# Patient Record
Sex: Male | Born: 1940 | Race: White | Hispanic: No | Marital: Married | Smoking: Never smoker
Health system: Southern US, Community
[De-identification: ages and names within clinical notes are randomized; demographics above are authoritative.]

## PROBLEM LIST (undated history)

## (undated) DIAGNOSIS — H539 Unspecified visual disturbance: Secondary | ICD-10-CM

## (undated) DIAGNOSIS — C61 Malignant neoplasm of prostate: Secondary | ICD-10-CM

## (undated) DIAGNOSIS — I2510 Atherosclerotic heart disease of native coronary artery without angina pectoris: Secondary | ICD-10-CM

## (undated) DIAGNOSIS — C801 Malignant (primary) neoplasm, unspecified: Secondary | ICD-10-CM

## (undated) DIAGNOSIS — I10 Essential (primary) hypertension: Secondary | ICD-10-CM

## (undated) HISTORY — DX: Malignant neoplasm of prostate: C61

## (undated) HISTORY — DX: Unspecified visual disturbance: H53.9

---

## 2017-10-13 ENCOUNTER — Emergency Department (HOSPITAL_COMMUNITY): Payer: Medicare Other

## 2017-10-13 ENCOUNTER — Emergency Department (HOSPITAL_COMMUNITY)
Admission: EM | Admit: 2017-10-13 | Discharge: 2017-10-13 | Disposition: A | Discharge status: Home / Self Care | Payer: Medicare Other | Attending: Emergency Medicine | Admitting: Emergency Medicine

## 2017-10-13 ENCOUNTER — Encounter (HOSPITAL_COMMUNITY): Payer: Self-pay

## 2017-10-13 DIAGNOSIS — Z8546 Personal history of malignant neoplasm of prostate: Secondary | ICD-10-CM | POA: Insufficient documentation

## 2017-10-13 DIAGNOSIS — I10 Essential (primary) hypertension: Secondary | ICD-10-CM | POA: Diagnosis not present

## 2017-10-13 DIAGNOSIS — I2510 Atherosclerotic heart disease of native coronary artery without angina pectoris: Secondary | ICD-10-CM | POA: Diagnosis not present

## 2017-10-13 DIAGNOSIS — R29898 Other symptoms and signs involving the musculoskeletal system: Secondary | ICD-10-CM | POA: Diagnosis not present

## 2017-10-13 HISTORY — DX: Atherosclerotic heart disease of native coronary artery without angina pectoris: I25.10

## 2017-10-13 HISTORY — DX: Malignant (primary) neoplasm, unspecified: C80.1

## 2017-10-13 HISTORY — DX: Essential (primary) hypertension: I10

## 2017-10-13 LAB — URINALYSIS, ROUTINE W REFLEX MICROSCOPIC
BILIRUBIN URINE: NEGATIVE
Glucose, UA: NEGATIVE mg/dL
HGB URINE DIPSTICK: NEGATIVE
KETONES UR: NEGATIVE mg/dL
Leukocytes, UA: NEGATIVE
Nitrite: NEGATIVE
PROTEIN: NEGATIVE mg/dL
Specific Gravity, Urine: 1.02 (ref 1.005–1.030)
pH: 5 (ref 5.0–8.0)

## 2017-10-13 LAB — COMPREHENSIVE METABOLIC PANEL
ALBUMIN: 4 g/dL (ref 3.5–5.0)
ALK PHOS: 76 U/L (ref 38–126)
ALT: 20 U/L (ref 17–63)
ANION GAP: 11 (ref 5–15)
AST: 24 U/L (ref 15–41)
BILIRUBIN TOTAL: 1.4 mg/dL — AB (ref 0.3–1.2)
BUN: 17 mg/dL (ref 6–20)
CALCIUM: 8.9 mg/dL (ref 8.9–10.3)
CO2: 23 mmol/L (ref 22–32)
CREATININE: 0.83 mg/dL (ref 0.61–1.24)
Chloride: 105 mmol/L (ref 101–111)
GFR calc Af Amer: >60 mL/min (ref >60)
GFR calc non Af Amer: >60 mL/min (ref >60)
GLUCOSE: 111 mg/dL — AB (ref 65–99)
Potassium: 4.1 mmol/L (ref 3.5–5.1)
Sodium: 139 mmol/L (ref 135–145)
TOTAL PROTEIN: 6.5 g/dL (ref 6.5–8.1)

## 2017-10-13 LAB — CBC WITH DIFFERENTIAL/PLATELET
BASOS PCT: 0 %
Basophils Absolute: 0 10*3/uL (ref 0.0–0.1)
Eosinophils Absolute: 0.1 10*3/uL (ref 0.0–0.7)
Eosinophils Relative: 2 %
HEMATOCRIT: 40.3 % (ref 39.0–52.0)
HEMOGLOBIN: 13 g/dL (ref 13.0–17.0)
LYMPHS ABS: 1.3 10*3/uL (ref 0.7–4.0)
Lymphocytes Relative: 22 %
MCH: 30.1 pg (ref 26.0–34.0)
MCHC: 32.3 g/dL (ref 30.0–36.0)
MCV: 93.3 fL (ref 78.0–100.0)
Monocytes Absolute: 0.4 10*3/uL (ref 0.1–1.0)
Monocytes Relative: 6 %
NEUTROS ABS: 4.2 10*3/uL (ref 1.7–7.7)
NEUTROS PCT: 70 %
Platelets: 178 10*3/uL (ref 150–400)
RBC: 4.32 MIL/uL (ref 4.22–5.81)
RDW: 13 % (ref 11.5–15.5)
WBC: 5.9 10*3/uL (ref 4.0–10.5)

## 2017-10-13 MED ORDER — MORPHINE SULFATE (PF) 4 MG/ML IV SOLN: 2.0000 mg | INTRAVENOUS | Status: DC | PRN

## 2017-10-13 MED ORDER — ONDANSETRON HCL 4 MG/2ML IJ SOLN: 4.0000 mg | Freq: Once | INTRAMUSCULAR | Status: DC

## 2017-10-13 MED ORDER — SODIUM CHLORIDE 0.9 % IV BOLUS (SEPSIS): 500.0000 mL | Freq: Once | INTRAVENOUS | Status: DC

## 2017-10-13 NOTE — ED Notes (Signed)
ED Provider at bedside. 

## 2017-10-13 NOTE — ED Provider Notes (Signed)
[facility]PART[facility] Provider Note   CSN: [id number] Arrival date & time: 10/13/17  1761     History   Chief Complaint Chief Complaint  Patient presents with  . Fall    HPI Isaac Escobar is a 77 y.o. male.  Chief complaint is left leg weakness  HPI 77 year old male.  He describes occasional falling for the last year.  It has been happening more frequently perhaps over the last 2 months.  He notes that when he walks his left leg seems weak and seems to "drag behind me".  He does not have pain in the leg.  He does have occasional right lower back pain.  No history of strokes.  He states at one point he was told by a physician that he "might of had a mini stroke".  He has some right lower back pain.  A year ago he had an MRI that showed "something on the right side".  He sees a neurologist that occasionally gives him injections into his right back.  He feels frustrated that his physicians have not addressed his left leg weakness.  Prostate cancer thought to be in remission.  No known metastases.  Has had stable PSAs for over 3 years.  Past Medical History:  Diagnosis Date  . Cancer [facility])    prostate  . Coronary artery disease   . Hypertension     There are no active problems to display for this patient.   History reviewed. No pertinent surgical history.     Home Medications    Prior to Admission medications   Not on File    Family History No family history on file.  Social History Social History   Tobacco Use  . Smoking status: Not on file  Substance Use Topics  . Alcohol use: Not on file  . Drug use: Not on file     Allergies   Patient has no known allergies.   Review of Systems Review of Systems  Constitutional: Negative for appetite change, chills, diaphoresis, fatigue and fever.  HENT: Negative for mouth sores, sore throat and trouble swallowing.   Eyes: Negative for visual disturbance.  Respiratory: Negative for  cough, chest tightness, shortness of breath and wheezing.   Cardiovascular: Negative for chest pain.  Gastrointestinal: Negative for abdominal distention, abdominal pain, diarrhea, nausea and vomiting.  Endocrine: Negative for polydipsia, polyphagia and polyuria.  Genitourinary: Negative for dysuria, frequency and hematuria.  Musculoskeletal: Positive for back pain. Negative for gait problem.  Skin: Negative for color change, pallor and rash.  Neurological: Positive for weakness. Negative for dizziness, syncope, light-headedness and headaches.  Hematological: Does not bruise/bleed easily.  Psychiatric/Behavioral: Negative for behavioral problems and confusion.     Physical Exam Updated Vital Signs BP 132/88   Pulse (!) 56   Temp 98.3 F (36.8 C) (Oral)   Resp 15   SpO2 96%   Physical Exam  Constitutional: He is oriented to person, place, and time. He appears well-developed and well-nourished. No distress.  HENT:  Head: Normocephalic.  Eyes: Conjunctivae are normal. Pupils are equal, round, and reactive to light. No scleral icterus.  Neck: Normal range of motion. Neck supple. No thyromegaly present.  Cardiovascular: Normal rate and regular rhythm. Exam reveals no gallop and no friction rub.  No murmur heard. Pulmonary/Chest: Effort normal and breath sounds normal. No respiratory distress. He has no wheezes. He has no rales.  Abdominal: Soft. Bowel sounds are normal. He exhibits no distension. There is no  tenderness. There is no rebound.  Musculoskeletal: Normal range of motion.  When he ambulates he does lead with his right leg and his left leg has difficulty flexing forward.  He does not have frank foot drop.  He does have a bit of a Trendelenburg gait.  Neurological: He is alert and oriented to person, place, and time.  Normal sensation of the bilateral lower extremities.  Reflexes are symmetric.  No gross asymmetry observed supine.  See muscular skeletal regarding strength    Skin: Skin is warm and dry. No rash noted.  Psychiatric: He has a normal mood and affect. His behavior is normal.     ED Treatments / Results  Labs (all labs ordered are listed, but only abnormal results are displayed) Labs Reviewed  COMPREHENSIVE METABOLIC PANEL - Abnormal; Notable for the following components:      Result Value   Glucose, Bld 111 (*)    Total Bilirubin 1.4 (*)    All other components within normal limits  CBC WITH DIFFERENTIAL/PLATELET  URINALYSIS, ROUTINE W REFLEX MICROSCOPIC    EKG  EKG Interpretation None       Radiology Dg Lumbar Spine Complete  Result Date: 10/13/2017 CLINICAL DATA:  Frequent mechanical falls. EXAM: LUMBAR SPINE - COMPLETE 4+ VIEW COMPARISON:  None. FINDINGS: Five non-rib-bearing vertebral bodies. Anterior compression deformity of L1 vertebral body with approximately 30% height loss. Multilevel osteoarthritic changes with disc space narrowing, endplate sclerosis and remodeling of vertebral bodies. Reversal of lumbosacral lordosis. Heavy calcific atherosclerotic disease of the aorta. 1.6 cm calcific density overlies the right upper quadrant, possibly representing a gallbladder stone. IMPRESSION: L1 anterior compression deformity with approximately 30% height loss likely represents age-indeterminate fracture. 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 Calcific atherosclerotic disease of the aorta. Probable cholelithiasis. Electronically Signed   By: Fidela Salisbury M.D.   On: 10/13/2017 12:36   Ct Head Wo Contrast  Result Date: 10/13/2017 CLINICAL DATA:  77 year old male with history of multiple falls over the past several weeks. EXAM: CT HEAD WITHOUT CONTRAST TECHNIQUE: Contiguous axial images were obtained from the base of the skull through the vertex without intravenous contrast. COMPARISON:  No priors. FINDINGS: Brain: Patchy and confluent areas of mild decreased attenuation are noted throughout the deep and  periventricular white matter of the cerebral hemispheres bilaterally, compatible with mild chronic microvascular ischemic disease. No evidence of acute infarction, hemorrhage, hydrocephalus, extra-axial collection or mass lesion/mass effect. Vascular: No hyperdense vessel or unexpected calcification. Skull: Normal. Negative for fracture or focal lesion. Sinuses/Orbits: No acute finding. Other: None. IMPRESSION: 1. No acute intracranial abnormalities. 2. Mild chronic microvascular ischemic changes in the cerebral white matter. Electronically Signed   By: Vinnie Langton M.D.   On: 10/13/2017 11:57   Mr Lumbar Spine Wo Contrast  Result Date: 10/13/2017 CLINICAL DATA:  Recent falls.  Left foot dragging.  Low back pain EXAM: MRI LUMBAR SPINE WITHOUT CONTRAST TECHNIQUE: Multiplanar, multisequence MR imaging of the lumbar spine was performed. No intravenous contrast was administered. COMPARISON:  Lumbar radiographs 10/13/2017 FINDINGS: Segmentation:  Normal.  Lowest disc space L5-S1 Alignment:  Normal Vertebrae: Chronic compression fracture L1. No acute fracture or mass lesion. Conus medullaris and cauda equina: Conus extends to the L1-2 level. Conus and cauda equina appear normal. Paraspinal and other soft tissues: Negative for mass or adenopathy. Disc levels: T12-L1: Moderate disc degeneration. Disc bulging and spurring without stenosis L1-2: Moderate disc degeneration with chronic Schmorl's node. Diffuse endplate spurring and mild facet degeneration. Subarticular and  foraminal narrowing on the left. Mild narrowing of the canal. L2-3: Disc degeneration. Mild disc bulging and spurring. Mild facet degeneration. Negative for stenosis. L3-4: Moderate disc degeneration with disc space narrowing and diffuse disc bulging. Extraforaminal disc protrusion on the right with displacement of the right L3 nerve root. Bilateral facet hypertrophy and mild spinal stenosis. L4-5: Moderate disc degeneration with disc bulging and  endplate spurring. Bilateral facet hypertrophy. Mild spinal stenosis. Moderate subarticular and foraminal stenosis bilaterally. L5-S1: Mild disc degeneration. Negative for stenosis or neural impingement IMPRESSION: Chronic compression fracture L1.  No acute fracture. Subarticular and foraminal narrowing on the left at L1-2 with mild narrowing of the canal. Extraforaminal disc protrusion on the right L3-4 with displacement of the right L3 nerve root. Mild spinal stenosis. Mild spinal stenosis L4-5 with moderate subarticular and foraminal stenosis bilaterally. Electronically Signed   By: Franchot Gallo M.D.   On: 10/13/2017 14:27    Procedures Procedures (including critical care time)  Medications Ordered in ED Medications - No data to display   Initial Impression / Assessment and Plan / ED Course  I have reviewed the triage vital signs and the nursing notes.  Pertinent labs & imaging results that were available during my care of the patient were reviewed by me and considered in my medical decision making (see chart for details).    He does not have tremors symptoms or findings suggesting of Parkinson's although he does have a bit of a shuffling gait.  He has normal use of his upper extremities.  His speech pattern is fluent and robust.  No tremor noted at exam.  MRI of his back does not show acute findings that would suggest left lower extremity weakness etiology.  CT of his head does not show stroke.  At this point I feel he is safe for outpatient follow-up with neurology.  Given a physical therapy referral regarding strength and proprioception testing and possible home exercise program.  Final Clinical Impressions(s) / ED Diagnoses   Final diagnoses:  Weakness of left lower extremity    ED Discharge Orders    None       Tanna Furry, MD 10/13/17 [phone]

## 2017-10-13 NOTE — Discharge Instructions (Signed)
Call Neurologist for out patient appointment. Call [facility] [place] for out patient evaluation

## 2017-10-13 NOTE — ED Triage Notes (Signed)
Pt presents for frequent mechanical falls over past few months. Patient reports left leg is "dragging" and causes falls. Has been seeing neuro and chiropracter but reports falls are becoming more frequent. Has R lower back pain but denies pain to L leg, states L leg does get numb and tingly from time to time.

## 2017-10-30 ENCOUNTER — Ambulatory Visit (INDEPENDENT_AMBULATORY_CARE_PROVIDER_SITE_OTHER): Payer: Medicare Other | Admitting: Neurology

## 2017-10-30 ENCOUNTER — Other Ambulatory Visit: Payer: Self-pay

## 2017-10-30 ENCOUNTER — Encounter: Payer: Self-pay | Admitting: Neurology

## 2017-10-30 VITALS — BP 141/76 | HR 56 | Resp 20 | Ht 74.0 in | Wt 203.0 lb

## 2017-10-30 DIAGNOSIS — M5442 Lumbago with sciatica, left side: Secondary | ICD-10-CM | POA: Diagnosis not present

## 2017-10-30 DIAGNOSIS — M5416 Radiculopathy, lumbar region: Secondary | ICD-10-CM | POA: Diagnosis not present

## 2017-10-30 DIAGNOSIS — M21372 Foot drop, left foot: Secondary | ICD-10-CM

## 2017-10-30 DIAGNOSIS — G8929 Other chronic pain: Secondary | ICD-10-CM | POA: Diagnosis not present

## 2017-10-30 DIAGNOSIS — I252 Old myocardial infarction: Secondary | ICD-10-CM | POA: Diagnosis not present

## 2017-10-30 DIAGNOSIS — Z8546 Personal history of malignant neoplasm of prostate: Secondary | ICD-10-CM | POA: Diagnosis not present

## 2017-10-30 MED ORDER — CELECOXIB 200 MG PO CAPS: 200.0000 mg | ORAL_CAPSULE | Freq: Every day | ORAL | 11 refills | Status: DC

## 2017-10-30 NOTE — Progress Notes (Signed)
[facility]  PATIENT: Isaac Escobar DOB: November 24, 1940  REFERRING DOCTOR OR PCP:  Dr. Vista Lawman  SOURCE: No trouble Dr. Vista Lawman,  bridging reports, MRI images on PACS.  _________________________________   HISTORICAL  CHIEF COMPLAINT:  Chief Complaint  Patient presents with  . Back Pain    Lower back pain radiating into left buttock and down posterior aspect of left leg, sometimes to foot, onset yrs. ago, but worse in the last year. Left leg occ. gives way and he falls--about 10 falls in the last year. Has seen Dr. Doy Hutching at [facility] and had esi but doesn't feel it helped. MRI and x-rays done 10/13/17 (in [facility])/fim  . Gait Disturbance    HISTORY OF PRESENT ILLNESS:  I had the pleasure seeing you patient, Isaac Escobar, at [facility] for neurologic consultation regarding his leg weakness in the lower back pain.  He is a 77 year old man who has had back pain off and on for many years and has a 1 year history of left leg weakness and his gait and causing some falls.    He reports discomfort in the lower back and pain that goes down the left leg towards the knee and lower leg but not into the foot.     Walking worsens the pain.    He notes a mild left foot drop, worse after walking longer distances.    He stumbles some and has fallen several times..    He denies fixed numbness.     He is not on any medications for pain or spasticity.      He had a lumbar ESI 10/2016 ([facility]) but had no benefit.     PT had helped mildly but there was no sustained benefit.  The MRI report from 10/16/2016 was formally interpreted as below. By my review of the images, the disc protrusion to the left at L4-L5 combined with the facet degenerative changes and ligamentum flavum hypertrophy lead to moderately severe lateral recess stenosis on the left with potential left L5 nerve root compression. There is also encroachment on the right L3 nerve root.       IMPRESSION: Chronic compression fracture L1.  No acute fracture.  Subarticular and foraminal narrowing on the left at L1-2 with mild narrowing of the canal.  Extraforaminal disc protrusion on the right L3-4 with displacement of the right L3 nerve root. Mild spinal stenosis.  Mild spinal stenosis L4-5 with moderate subarticular and foraminal stenosis bilaterally.  He has a h/o of an MI in the past.   He has had prostate cancer.   There was no evidecne of cancer in the lumbar spine.      REVIEW OF SYSTEMS: Constitutional: No fevers, chills, sweats, or change in appetite Eyes: No visual changes, double vision, eye pain Ear, nose and throat: No hearing loss, ear pain, nasal congestion, sore throat Cardiovascular: No chest pain, palpitations.  He has history of myocardial infarction Respiratory: No shortness of breath at rest or with exertion.   No wheezes GastrointestinaI: No nausea, vomiting, diarrhea, abdominal pain, fecal incontinence Genitourinary: No dysuria, urinary retention or frequency.  No nocturia. Musculoskeletal: As above Integumentary: No rash, pruritus, skin lesions Neurological: as above Psychiatric: No depression at this time.  No anxiety Endocrine: No palpitations, diaphoresis, change in appetite, change in weigh or increased thirst Hematologic/Lymphatic: No anemia, purpura, petechiae. Allergic/Immunologic: No itchy/runny eyes, nasal congestion, recent allergic reactions, rashes  ALLERGIES: Allergies  Allergen Reactions  . Penicillins Swelling    HOME  MEDICATIONS:  Current Outpatient Medications:  .  aspirin 81 MG chewable tablet, Chew 81 mg by mouth daily., Disp: , Rfl:  .  atorvastatin (LIPITOR) 80 MG tablet, , Disp: , Rfl:  .  metoprolol succinate (TOPROL-XL) 25 MG 24 hr tablet, , Disp: , Rfl:   PAST MEDICAL HISTORY: Past Medical History:  Diagnosis Date  . Cancer [facility])    prostate  . Coronary artery disease   . Hypertension   . Prostate  cancer ([place])   . Vision abnormalities     PAST SURGICAL HISTORY: History reviewed. No pertinent surgical history.  FAMILY HISTORY: Family History  Problem Relation Age of Onset  . Dementia Mother   . Cirrhosis Father     SOCIAL HISTORY:  Social History   Socioeconomic History  . Marital status: Married    Spouse name: Not on file  . Number of children: Not on file  . Years of education: Not on file  . Highest education level: Not on file  Social Needs  . Financial resource strain: Not on file  . Food insecurity - worry: Not on file  . Food insecurity - inability: Not on file  . Transportation needs - medical: Not on file  . Transportation needs - non-medical: Not on file  Occupational History  . Not on file  Tobacco Use  . Smoking status: Never Smoker  . Smokeless tobacco: Current User  Substance and Sexual Activity  . Alcohol use: No    Frequency: Never  . Drug use: No  . Sexual activity: Not on file  Other Topics Concern  . Not on file  Social History Narrative  . Not on file     PHYSICAL EXAM  Vitals:   10/30/17 1033  BP: (!) 141/76  Pulse: (!) 56  Resp: 20  Weight: 203 lb (92.1 kg)  Height: 6\' 2"  (1.88 m)    Body mass index is 26.06 kg/m.   General: The patient is well-developed and well-nourished and in no acute distress  Eyes:  Funduscopic exam shows normal optic discs and retinal vessels.  Neck: The neck is supple, no carotid bruits are noted.  The neck is nontender.  Cardiovascular: The heart has a regular rate and rhythm with a normal S1 and S2. There were no murmurs, gallops or rubs. Lungs are clear to auscultation.  Skin: Extremities are without significant edema.  Musculoskeletal:  Back is nontender  Neurologic Exam  Mental status: The patient is alert and oriented x 3 at the time of the examination. The patient has apparent normal recent and remote memory, with an apparently normal attention span and concentration ability.    Speech is normal.  Cranial nerves: Extraocular movements are full. Pupils are equal, round, and reactive to light and accomodation.  Visual fields are full.  Facial symmetry is present. There is good facial sensation to soft touch bilaterally.Facial strength is normal.  Trapezius and sternocleidomastoid strength is normal. No dysarthria is noted.  The tongue is midline, and the patient has symmetric elevation of the soft palate. No obvious hearing deficits are noted.  Motor:  Muscle bulk is normal.   Tone is normal. Strength is  5 / 5 in the arms and the right leg. Strength is 4/5 in the left EHL and 4+/5 in the left ankle extensors.  Sensory: Sensory testing is intact to pinprick, soft touch and vibration sensation in all 4 extremities.  Coordination: Cerebellar testing reveals good finger-nose-finger and heel-to-shin bilaterally.  Gait and station: Station  is normal.   He has a wide stance and mildly reduced stride. There is a left foot drop.   . The left shoe is worn more at the toes and the right shoe.  Reflexes: Deep tendon reflexes are symmetric and normal bilaterally.   Plantar responses are flexor.    DIAGNOSTIC DATA (LABS, IMAGING, TESTING) - I reviewed patient records, labs, notes, testing and imaging myself where available.  Lab Results  Component Value Date   WBC 5.9 10/13/2017   HGB 13.0 10/13/2017   HCT 40.3 10/13/2017   MCV 93.3 10/13/2017   PLT 178 10/13/2017      Component Value Date/Time   NA 139 10/13/2017 1009   K 4.1 10/13/2017 1009   CL 105 10/13/2017 1009   CO2 23 10/13/2017 1009   GLUCOSE 111 (H) 10/13/2017 1009   BUN 17 10/13/2017 1009   CREATININE 0.83 10/13/2017 1009   CALCIUM 8.9 10/13/2017 1009   PROT 6.5 10/13/2017 1009   ALBUMIN 4.0 10/13/2017 1009   AST 24 10/13/2017 1009   ALT 20 10/13/2017 1009   ALKPHOS 76 10/13/2017 1009   BILITOT 1.4 (H) 10/13/2017 1009   GFRNONAA >60 10/13/2017 1009   GFRAA >60 10/13/2017 1009       ASSESSMENT  AND PLAN  Left foot drop - Plan: Ambulatory referral to Physical Therapy  Lumbar radiculopathy  Chronic bilateral low back pain with left-sided sciatica  History of prostate cancer  History of MI (myocardial infarction)   In summary, Mr. Isaac Escobar is a 77 year old man who has had difficulty with her back pain and left leg pain and weakness. He gets a left foot drop that worsens as he walks longer distances. He has fallen several times. I personally reviewed the MRI of the lumbar spine and he has multilevel degenerative changes. He does appear to have lateral recess stenosis to the left at L4-L5 with some impingement of the left L5 nerve root. There is also encroachment on the right L3 nerve root.   I believe the changes at L4-L5 a most likely responsible for his symptoms. He did not get any benefit from an ESI.    I'm going to start Celebrex and have him do another course of physical therapy. If he does not note significant improvement in  left leg strength, we will consider referring him for left left AFO brace which might help the foot drop and falls.  He will return to see me in 4 months or sooner if her new or worsening neurologic symptoms.  Thank you for asking me to see Mr. Isaac Escobar. Please let me know if I can be of further assistance with her or other patients in the future per   Richard A. Felecia Shelling, MD, [facility] 10/26/3084, 57:84 AM Certified in Neurology, Clinical Neurophysiology, Sleep Medicine, Pain Medicine and Neuroimaging  [facility] [address] [place], [place] [address] [phone]

## 2017-11-05 ENCOUNTER — Ambulatory Visit: Payer: Medicare Other | Admitting: Physical Therapy

## 2017-11-08 ENCOUNTER — Ambulatory Visit: Payer: Medicare Other | Attending: Neurology | Admitting: Physical Therapy

## 2017-11-08 ENCOUNTER — Other Ambulatory Visit: Payer: Self-pay

## 2017-11-08 DIAGNOSIS — M545 Low back pain, unspecified: Secondary | ICD-10-CM

## 2017-11-08 DIAGNOSIS — G8929 Other chronic pain: Secondary | ICD-10-CM | POA: Insufficient documentation

## 2017-11-08 DIAGNOSIS — M6281 Muscle weakness (generalized): Secondary | ICD-10-CM

## 2017-11-08 DIAGNOSIS — M21372 Foot drop, left foot: Secondary | ICD-10-CM | POA: Diagnosis present

## 2017-11-08 DIAGNOSIS — R296 Repeated falls: Secondary | ICD-10-CM | POA: Diagnosis present

## 2017-11-08 NOTE — Therapy (Signed)
[facility] [address]  [place], [place], [address] Phone: [phone]   Fax:  [phone]  Physical Therapy Evaluation  Patient Details  Name: Isaac Escobar MRN: [id number] Date of Birth: [birth year] Referring Provider: Arlice Colt, MD   Encounter Date: 11/08/2017  PT End of Session - 11/08/17 1528    Visit Number  1    Number of Visits  16    Date for PT Re-Evaluation  01/04/18    Authorization Type  Medicare & BCBS    PT Start Time  1528    PT Stop Time  1621    PT Time Calculation (min)  53 min    Activity Tolerance  Patient tolerated treatment well    Behavior During Therapy  [facility] for tasks assessed/performed       Past Medical History:  Diagnosis Date  . Cancer [facility])    prostate  . Coronary artery disease   . Hypertension   . Prostate cancer ([place])   . Vision abnormalities     No past surgical history on file.  There were no vitals filed for this visit.   Subjective Assessment - 11/08/17 1534    Subjective  Pt reports long history of back trouble dating to the early 90s, with imaging studies revealing degenerative changes. Pt reports he has been dragging his L foot for about a year and this has caused him to fall mutiple times.    Limitations  Walking    Diagnostic tests  Lumbar MRI 10/13/17: Chronic compression fracture L1.  No acute fracture. Subarticular and foraminal narrowing on the left at L1-2 with mild narrowing of the canal. Extraforaminal disc protrusion on the right L3-4 with displacement of the right L3 nerve root and mild spinal stenosis. Mild spinal stenosis L4-5 with moderate subarticular and foraminal stenosis bilaterally.    Patient Stated Goals  "to get me a good walking gait"    Currently in Pain?  No/denies    Pain Score  0-No pain up to 5/10    Pain Location  Back    Pain Orientation  Lower;Right    Pain Descriptors / Indicators  Sharp    Pain Type  Acute pain;Chronic pain     Pain Onset  More than a month ago    Pain Frequency  Intermittent    Aggravating Factors   unpredictable    Pain Relieving Factors  Celebrex         [facility] PT Assessment - 11/08/17 1528      Assessment   Medical Diagnosis  L foot drop/LE weakness; LBP    Referring Provider  Arlice Colt, MD    Onset Date/Surgical Date  -- ~1 yr for foot drop; chronic LBP    Next MD Visit  02/27/18    Prior Therapy  h/o PT for low back w/o sustained benefit      Precautions   Precautions  Fall      Balance Screen   Has the patient fallen in the past 6 months  Yes    How many times?  5    Has the patient had a decrease in activity level because of a fear of falling?   Yes    Is the patient reluctant to leave their home because of a fear of falling?   No      Home Film/video editor residence    Living Arrangements  Spouse/significant other  Type of [place] to enter    Entrance Stairs-Number of Steps  2    Entrance Stairs-Rails  -- "door facing"    Home Layout  One level    [place] - single point;Shower seat      Prior Function   Level of Independence  Independent    Vocation  Retired    Leisure  watches grandchildren playing sports; works in yard      Editor, commissioning  Impaired by gross assessment    Additional Comments  L LE deminished      Coordination   Gross Motor Movements are Fluid and Coordinated  No    Heel Shin Test  impaired on L      ROM / Strength   AROM / PROM / Strength  Strength      Strength   Strength Assessment Site  Hip;Knee;Ankle    Right/Left Hip  Right;Left    Right Hip Flexion  4-/5    Right Hip Extension  4-/5    Right Hip External Rotation   4-/5    Right Hip Internal Rotation  4-/5    Right Hip ABduction  4/5    Right Hip ADduction  4-/5    Left Hip Flexion  3+/5    Left Hip Extension  3+/5    Left Hip External Rotation  3+/5    Left Hip Internal Rotation  3+/5    Left Hip  ABduction  3+/5    Left Hip ADduction  3-/5    Right/Left Knee  Right;Left    Right Knee Flexion  4+/5    Right Knee Extension  5/5    Left Knee Flexion  4/5    Left Knee Extension  4/5    Right/Left Ankle  Right;Left    Right Ankle Dorsiflexion  4+/5    Right Ankle Plantar Flexion  4/5    Right Ankle Inversion  4+/5    Right Ankle Eversion  4/5    Left Ankle Dorsiflexion  3+/5    Left Ankle Plantar Flexion  3-/5    Left Ankle Inversion  3+/5    Left Ankle Eversion  3+/5      Flexibility   Soft Tissue Assessment /Muscle Length  yes    Hamstrings  mod/severe tight B    Quadriceps  mod/severe tight B    ITB  mod tight B    Piriformis  mod tight B      Ambulation/Gait   Ambulation Distance (Feet)  60 Feet    Assistive device  None    Gait Pattern  Step-to pattern;Decreased stride length;Decreased step length - left;Decreased step length - right;Decreased hip/knee flexion - left;Left circumduction;Poor foot clearance - left    Ambulation Surface  Level;Indoor    Gait velocity  2.17 ft/sec w/o AD      Standardized Balance Assessment   Standardized Balance Assessment  10 meter walk test    10 Meter Walk  15.12"             Objective measurements completed on examination: See above findings.                PT Short Term Goals - 11/08/17 1621      PT SHORT TERM GOAL #1   Title  Independent with initial HEP    Status  New    Target Date  11/29/17  PT Long Term Goals - 11/08/17 1621      PT LONG TERM GOAL #1   Title  Independent with ongoing strengthening +/- balance HEP as indicated    Status  New    Target Date  01/04/18      PT LONG TERM GOAL #2   Title  Overall LE strength >/= 4/5 with L hip and foot at least 4-/5    Status  New    Target Date  01/04/18      PT LONG TERM GOAL #3   Title  Pt will ambulate with normal gait pattern with consistent L foot clearance to decrease risk for falls    Status  New    Target Date  01/04/18       PT LONG TERM GOAL #4   Title  Gait speed >/= 2.62 ft/sec to increase safety with community ambulation    Status  New    Target Date  01/04/18             Plan - 11/08/17 1621    Clinical Impression Statement  Devin is 77 y/o male who presents to OP PT for L LE weakness/foot drop potentially secondary to multilevel degenerative changes in the lumbar spine with MD impression that left L5 nerve root impingement most likely responsible for his symptoms. Pt reports chronic LBP dating back to the early 1990's with current pain typically intermittent and localized to R lumbar paraspinals. Pt demonstrates reduced core and B LE strength most pronounced in L hip and ankle, as well as moderate to significant tissue tightness with decreased flexibility noted at proximal hips. Limited flexibility and LE weakness contribute to altered gait pattern, most notable for decreased L hip and knee flexion, decreased L DF/poor foot clearance, and decreased gait speed at 2.17 ft/sec all of which contribute to increased risk for falls. Gait and musculoskeletal deficits limiting activity tolerance and ability to participate in daily tasks and desired leisure activities.  Pt will benefit from skilled PT to address deficits listed to improve safety and stability with gait for decreased fall risk.    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; h/o CAD s/p MI, HTN    Clinical Presentation  Evolving    Clinical Presentation due to:  recurrent falls (at least 5 in the past 6 months); complex active medical problems and history    Clinical Decision Making  Moderate    Rehab Potential  Good    PT Frequency  2x / week    PT Duration  8 weeks    PT Treatment/Interventions  Patient/family education;Neuromuscular re-education;Therapeutic exercise;Therapeutic activities;Functional mobility training;Gait  training;Balance training;Electrical Stimulation;Moist Heat;Ultrasound;Traction;Manual techniques;Dry needling;Taping;ADLs/Self Care Home Management    Consulted and Agree with Plan of Care  Patient       Patient will benefit from skilled therapeutic intervention in order to improve the following deficits and impairments:  Abnormal gait, Difficulty walking, Decreased strength, Decreased range of motion, Impaired flexibility, Decreased coordination, Decreased balance, Decreased activity tolerance, Increased muscle spasms  Visit Diagnosis: Foot drop, left  Muscle weakness (generalized)  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   Percival Spanish, PT, MPT 11/08/2017, 6:08 PM  [facility] [place]  [place], [place], [address] Phone: [phone]   Fax:  [phone]  Name: Fausto Sampedro MRN: [id number] Date of Birth: 10/24/1940

## 2017-11-12 ENCOUNTER — Ambulatory Visit: Payer: Medicare Other | Admitting: Physical Therapy

## 2017-11-12 ENCOUNTER — Encounter: Payer: Self-pay | Admitting: Physical Therapy

## 2017-11-12 DIAGNOSIS — M21372 Foot drop, left foot: Secondary | ICD-10-CM

## 2017-11-12 DIAGNOSIS — M545 Low back pain, unspecified: Secondary | ICD-10-CM

## 2017-11-12 DIAGNOSIS — G8929 Other chronic pain: Secondary | ICD-10-CM

## 2017-11-12 DIAGNOSIS — M6281 Muscle weakness (generalized): Secondary | ICD-10-CM

## 2017-11-12 DIAGNOSIS — R296 Repeated falls: Secondary | ICD-10-CM

## 2017-11-12 NOTE — Therapy (Signed)
[facility] [address]  [place], [place], [address] Phone: [phone]   Fax:  [phone]  Physical Therapy Treatment  Patient Details  Name: Isaac Escobar MRN: [id number] Date of Birth: [birth year] Referring Provider: Arlice Colt, MD   Encounter Date: 11/12/2017  PT End of Session - 11/12/17 0847    Visit Number  2    Number of Visits  16    Date for PT Re-Evaluation  01/04/18    Authorization Type  Medicare & BCBS    PT Start Time  0847    PT Stop Time  0929    PT Time Calculation (min)  42 min    Activity Tolerance  Patient tolerated treatment well    Behavior During Therapy  [facility] for tasks assessed/performed       Past Medical History:  Diagnosis Date  . Cancer [facility])    prostate  . Coronary artery disease   . Hypertension   . Prostate cancer ([place])   . Vision abnormalities     History reviewed. No pertinent surgical history.  There were no vitals filed for this visit.  Subjective Assessment - 11/12/17 0850    Subjective  Pt denies pain, reporting "no more than the usual aches that you learn to live with".    Diagnostic tests  Lumbar MRI 10/13/17: Chronic compression fracture L1.  No acute fracture. Subarticular and foraminal narrowing on the left at L1-2 with mild narrowing of the canal. Extraforaminal disc protrusion on the right L3-4 with displacement of the right L3 nerve root and mild spinal stenosis. Mild spinal stenosis L4-5 with moderate subarticular and foraminal stenosis bilaterally.    Patient Stated Goals  "to get me a good walking gait"    Currently in Pain?  No/denies    Pain Onset  More than a month ago                      [facility] Adult PT Treatment/Exercise - 11/12/17 0847      Knee/Hip Exercises: Stretches   Passive Hamstring Stretch  Both;30 seconds;2 reps    Passive Hamstring Stretch Limitations  supine with strap    ITB Stretch  Both;30 seconds;2 reps    ITB  Stretch Limitations  supine with strap    Piriformis Stretch  Both;30 seconds;2 reps    Piriformis Stretch Limitations  KTOS    Other Knee/Hip Stretches  SKTC 2x30"      Knee/Hip Exercises: Aerobic   Nustep  L4 x 6' (LE only)      Ankle Exercises: Seated   Heel Raises  20 reps;3 seconds    Toe Raise  20 reps;3 seconds    Other Seated Ankle Exercises  L 4 way ankle with yellow TB x15             PT Education - 11/12/17 0925    Education provided  Yes    Education Details  Initial HEP    Person(s) Educated  Patient    Methods  Explanation;Demonstration;Handout    Comprehension  Verbalized understanding;Returned demonstration;Need further instruction       PT Short Term Goals - 11/12/17 7253      PT SHORT TERM GOAL #1   Title  Independent with initial HEP    Status  On-going        PT Long Term Goals - 11/12/17 6644      PT LONG TERM GOAL #1   Title  Independent with ongoing strengthening +/- balance HEP as indicated    Status  On-going      PT LONG TERM GOAL #2   Title  Overall LE strength >/= 4/5 with L hip and foot at least 4-/5    Status  On-going      PT LONG TERM GOAL #3   Title  Pt will ambulate with normal gait pattern with consistent L foot clearance to decrease risk for falls    Status  On-going      PT LONG TERM GOAL #4   Title  Gait speed >/= 2.62 ft/sec to increase safety with community ambulation    Status  On-going            Plan - 11/12/17 4562    Clinical Impression Statement  Pt reporting remote h/o grade 5 L ankle sprain that was never rehabed and still swells with increased activity - pt feeling like this contributes to his problems on the L. Provided instruction in initial HEP focusing on improving proximal flexibility and initial ankle strengthening with pt noting "feels better already" by end of session.    Rehab Potential  Good    PT Treatment/Interventions  Patient/family education;Neuromuscular re-education;Therapeutic  exercise;Therapeutic activities;Functional mobility training;Gait training;Balance training;Electrical Stimulation;Moist Heat;Ultrasound;Traction;Manual techniques;Dry needling;Taping;ADLs/Self Care Home Management    Consulted and Agree with Plan of Care  Patient       Patient will benefit from skilled therapeutic intervention in order to improve the following deficits and impairments:  Abnormal gait, Difficulty walking, Decreased strength, Decreased range of motion, Impaired flexibility, Decreased coordination, Decreased balance, Decreased activity tolerance, Increased muscle spasms  Visit Diagnosis: Foot drop, left  Muscle weakness (generalized)  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   Percival Spanish, PT, MPT 11/12/2017, 9:37 AM  [facility] [address]  [place], [place], [address] Phone: [phone]   Fax:  [phone]  Name: Isaac Escobar MRN: [id number] Date of Birth: 16-Feb-1941

## 2017-11-15 ENCOUNTER — Ambulatory Visit: Payer: Medicare Other | Admitting: Physical Therapy

## 2017-11-15 ENCOUNTER — Encounter: Payer: Self-pay | Admitting: Physical Therapy

## 2017-11-15 DIAGNOSIS — M21372 Foot drop, left foot: Secondary | ICD-10-CM | POA: Diagnosis not present

## 2017-11-15 DIAGNOSIS — G8929 Other chronic pain: Secondary | ICD-10-CM

## 2017-11-15 DIAGNOSIS — M545 Low back pain: Secondary | ICD-10-CM

## 2017-11-15 DIAGNOSIS — R296 Repeated falls: Secondary | ICD-10-CM

## 2017-11-15 DIAGNOSIS — M6281 Muscle weakness (generalized): Secondary | ICD-10-CM

## 2017-11-15 NOTE — Patient Instructions (Signed)

## 2017-11-15 NOTE — Therapy (Signed)
[facility] [address]  [place], [place], [address] Phone: [phone]   Fax:  [phone]  Physical Therapy Evaluation  Patient Details  Name: Isaac Escobar MRN: [id number] Date of Birth: 06-Jul-1941 Referring Provider: Arlice Colt, MD   Encounter Date: 11/15/2017  PT End of Session - 11/15/17 0844    Visit Number  3    Number of Visits  16    Date for PT Re-Evaluation  01/04/18    Authorization Type  Medicare & BCBS    PT Start Time  0844    PT Stop Time  0936    PT Time Calculation (min)  52 min    Activity Tolerance  Patient tolerated treatment well    Behavior During Therapy  [facility] for tasks assessed/performed       Past Medical History:  Diagnosis Date  . Cancer [facility])    prostate  . Coronary artery disease   . Hypertension   . Prostate cancer ([place])   . Vision abnormalities     History reviewed. No pertinent surgical history.  There were no vitals filed for this visit.   Subjective Assessment - 11/15/17 0848    Subjective  Pt reporting he fell yesterday while trying to carry laundry in from outdoor utility room in [place]. Fall resulting in mutiple bruises and abrasions on back of head, R hand, R elbow, L ribs, R buttock, and B knees.    Diagnostic tests  Lumbar MRI 10/13/17: Chronic compression fracture L1.  No acute fracture. Subarticular and foraminal narrowing on the left at L1-2 with mild narrowing of the canal. Extraforaminal disc protrusion on the right L3-4 with displacement of the right L3 nerve root and mild spinal stenosis. Mild spinal stenosis L4-5 with moderate subarticular and foraminal stenosis bilaterally.    Patient Stated Goals  "to get me a good walking gait"    Currently in Pain?  Yes    Pain Score  4     Pain Location  -- back of head, R hand, R elbow, L ribs, R buttock, and B knees    Pain Descriptors / Indicators  Sore "bruised"    Pain Type  Acute pain                  Objective measurements completed on examination: See above findings.      [facility] Adult PT Treatment/Exercise - 11/15/17 0844      Self-Care   Self-Care  Posture    Posture  Provided education in posture and body mechanics for typical daily tasks.      Lumbar Exercises: Supine   Pelvic Tilt  10 reps;5 seconds    Clam  10 reps;3 seconds    Clam Limitations  abd bracing + alt hip ABD/ER with red TB    Bent Knee Raise  10 reps;3 seconds    Bent Knee Raise Limitations  brace marching    Bridge  10 reps;5 seconds    Bridge Limitations  + hip ABD isometric with red TB      Knee/Hip Exercises: Aerobic   Nustep  L4 x 6' (LE only)             PT Education - 11/15/17 0920    Education provided  Yes    Education Details  Posture & body mechanics education    Person(s) Educated  Patient    Methods  Explanation;Demonstration;Handout    Comprehension  Verbalized understanding  PT Short Term Goals - 11/12/17 1941      PT SHORT TERM GOAL #1   Title  Independent with initial HEP    Status  On-going        PT Long Term Goals - 11/12/17 7408      PT LONG TERM GOAL #1   Title  Independent with ongoing strengthening +/- balance HEP as indicated    Status  On-going      PT LONG TERM GOAL #2   Title  Overall LE strength >/= 4/5 with L hip and foot at least 4-/5    Status  On-going      PT LONG TERM GOAL #3   Title  Pt will ambulate with normal gait pattern with consistent L foot clearance to decrease risk for falls    Status  On-going      PT LONG TERM GOAL #4   Title  Gait speed >/= 2.62 ft/sec to increase safety with community ambulation    Status  On-going             Plan - 11/15/17 0915    Clinical Impression Statement  Isaac Escobar reporting a fall yesterday when he was distracted by a fire truck while trying to carry a load of laundry into the house, suffering multiple bruises and abrasions and reporting just overall soreness. Reports  good compliance with HEP prior to fall and denies any concerns or need for review. Given overall soreness, proceeded conservatively with exercises today focusing primarily on lumbopelvic strengthening for improved low back support and core stability as well as training in posture and body mechanics to reduce lumbar strain. Pt reporting good tolernace for exercises with no increased pain reported.    History and Personal Factors relevant to plan of care:       Rehab Potential  Good    PT Treatment/Interventions  Patient/family education;Neuromuscular re-education;Therapeutic exercise;Therapeutic activities;Functional mobility training;Gait training;Balance training;Electrical Stimulation;Moist Heat;Ultrasound;Traction;Manual techniques;Dry needling;Taping;ADLs/Self Care Home Management    Consulted and Agree with Plan of Care  Patient       Patient will benefit from skilled therapeutic intervention in order to improve the following deficits and impairments:  Abnormal gait, Difficulty walking, Decreased strength, Decreased range of motion, Impaired flexibility, Decreased coordination, Decreased balance, Decreased activity tolerance, Increased muscle spasms  Visit Diagnosis: Foot drop, left  Muscle weakness (generalized)  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   Percival Spanish, PT, MPT 11/15/2017, 1:28 PM  [facility] [address]  [place], [place], [address] Phone: [phone]   Fax:  [phone]  Name: Isaac Escobar MRN: [id number] Date of Birth: 1940/12/29

## 2017-11-20 ENCOUNTER — Ambulatory Visit: Payer: Medicare Other

## 2017-11-20 DIAGNOSIS — G8929 Other chronic pain: Secondary | ICD-10-CM

## 2017-11-20 DIAGNOSIS — M545 Low back pain, unspecified: Secondary | ICD-10-CM

## 2017-11-20 DIAGNOSIS — M6281 Muscle weakness (generalized): Secondary | ICD-10-CM

## 2017-11-20 DIAGNOSIS — M21372 Foot drop, left foot: Secondary | ICD-10-CM

## 2017-11-20 DIAGNOSIS — R296 Repeated falls: Secondary | ICD-10-CM

## 2017-11-20 NOTE — Therapy (Signed)
[facility] [address]  [place], [place], [address] Phone: [phone]   Fax:  [phone]  Physical Therapy Treatment  Patient Details  Name: Isaac Escobar MRN: [id number] Date of Birth: September 25, 1941 Referring Provider: Arlice Colt, MD   Encounter Date: 11/20/2017  PT End of Session - 11/20/17 0847    Visit Number  4    Number of Visits  16    Date for PT Re-Evaluation  01/04/18    Authorization Type  Medicare & BCBS    PT Start Time  [phone]    PT Stop Time  0930    PT Time Calculation (min)  46 min    Activity Tolerance  Patient tolerated treatment well    Behavior During Therapy  [facility] for tasks assessed/performed       Past Medical History:  Diagnosis Date  . Cancer [facility])    prostate  . Coronary artery disease   . Hypertension   . Prostate cancer ([place])   . Vision abnormalities     No past surgical history on file.  There were no vitals filed for this visit.  Subjective Assessment - 11/20/17 0905    Subjective  Isaac Escobar reporting he fell in front yard onto L knee however denies bruising from this, "I fell in my wet yard".      Diagnostic tests  Lumbar MRI 10/13/17: Chronic compression fracture L1.  No acute fracture. Subarticular and foraminal narrowing on the left at L1-2 with mild narrowing of the canal. Extraforaminal disc protrusion on the right L3-4 with displacement of the right L3 nerve root and mild spinal stenosis. Mild spinal stenosis L4-5 with moderate subarticular and foraminal stenosis bilaterally.    Patient Stated Goals  "to get me a good walking gait"    Currently in Pain?  No/denies    Pain Score  0-No pain    Multiple Pain Sites  No                      [facility] Adult PT Treatment/Exercise - 11/20/17 0901      Lumbar Exercises: Supine   Ab Set  10 reps;5 seconds    AB Set Limitations  Required tactile cueing for proper adominal activation  Required frequent cueing to stay  on task    Clam  3 seconds;15 reps Required frequent cueing to stay on task    Clam Limitations  abd bracing + alt hip ABD/ER with red TB    Bent Knee Raise  3 seconds;15 reps Required frequent cueing to stay on task    Bent Knee Raise Limitations  red looped TB at knees + brace marching    Bridge  10 reps;5 seconds    Bridge Limitations  + hip ABD isometric with red TB      Knee/Hip Exercises: Aerobic   Nustep  L4 x 6' (LE only)      Knee/Hip Exercises: Standing   Other Standing Knee Exercises  Alternating toe-clears to 8" step with 1 HH assist x 10 reps each side       Knee/Hip Exercises: Seated   Long Arc Quad  Right;Left;10 reps Required frequent cueing to stay on task    Long [employer] Limitations  adduction ball squeeze'    Ball Squeeze  5" x 10 reps  Required frequent cueing to stay on task      Knee/Hip Exercises: Sidelying   Clams  B clam shell x 15  reps (no resistance)             PT Education - 11/20/17 1238    Education provided  Yes    Education Details  Brace marching, adduction ball squeeze     Person(s) Educated  Patient    Methods  Explanation;Verbal cues;Handout    Comprehension  Verbalized understanding;Verbal cues required;Need further instruction       PT Short Term Goals - 11/12/17 5597      PT SHORT TERM GOAL #1   Title  Independent with initial HEP    Status  On-going        PT Long Term Goals - 11/12/17 4163      PT LONG TERM GOAL #1   Title  Independent with ongoing strengthening +/- balance HEP as indicated    Status  On-going      PT LONG TERM GOAL #2   Title  Overall LE strength >/= 4/5 with L hip and foot at least 4-/5    Status  On-going      PT LONG TERM GOAL #3   Title  Pt will ambulate with normal gait pattern with consistent L foot clearance to decrease risk for falls    Status  On-going      PT LONG TERM GOAL #4   Title  Gait speed >/= 2.62 ft/sec to increase safety with community ambulation    Status  On-going             Plan - 11/20/17 0850    Clinical Impression Statement  Isaac Escobar seen to start treatment reporting he fell onto L knee in front yard however denies bruising from this reporting, "I just fell in my wet front yard and didn't really hurt anything".  Treatment focusing on gentle lumbopelvic strengthening, and standing stepping activities for improved LE clearance with pt. requiring HH assistance from therapist.  Isaac Escobar requiring frequent cueing today to stay on tasks as he is easily distracted and has tendency to pause activities to talk.  HEP issued to pt.  Will monitor response to this and adjust as need in coming visits.    PT Treatment/Interventions  Patient/family education;Neuromuscular re-education;Therapeutic exercise;Therapeutic activities;Functional mobility training;Gait training;Balance training;Electrical Stimulation;Moist Heat;Ultrasound;Traction;Manual techniques;Dry needling;Taping;ADLs/Self Care Home Management    Consulted and Agree with Plan of Care  Patient       Patient will benefit from skilled therapeutic intervention in order to improve the following deficits and impairments:  Abnormal gait, Difficulty walking, Decreased strength, Decreased range of motion, Impaired flexibility, Decreased coordination, Decreased balance, Decreased activity tolerance, Increased muscle spasms  Visit Diagnosis: Foot drop, left  Muscle weakness (generalized)  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   Bess Harvest, PTA 11/20/17 12:44 PM  [facility] [address]  [place], [place], [address] Phone: [phone]   Fax:  [phone]  Name: Isaac Escobar MRN: [id number] Date of Birth: 10/28/1940

## 2017-11-23 ENCOUNTER — Ambulatory Visit: Payer: Medicare Other | Attending: Neurology | Admitting: Physical Therapy

## 2017-11-23 ENCOUNTER — Encounter: Payer: Self-pay | Admitting: Physical Therapy

## 2017-11-23 DIAGNOSIS — M21372 Foot drop, left foot: Secondary | ICD-10-CM

## 2017-11-23 DIAGNOSIS — M6281 Muscle weakness (generalized): Secondary | ICD-10-CM | POA: Insufficient documentation

## 2017-11-23 DIAGNOSIS — G8929 Other chronic pain: Secondary | ICD-10-CM | POA: Insufficient documentation

## 2017-11-23 DIAGNOSIS — R296 Repeated falls: Secondary | ICD-10-CM | POA: Insufficient documentation

## 2017-11-23 DIAGNOSIS — M545 Low back pain: Secondary | ICD-10-CM | POA: Diagnosis present

## 2017-11-23 NOTE — Therapy (Signed)
[facility] [address]  [place], [place], [address] Phone: [phone]   Fax:  [phone]  Physical Therapy Treatment  Patient Details  Name: Josuha Fontanez MRN: [id number] Date of Birth: 11/11/1940 Referring Provider: Arlice Colt, MD   Encounter Date: 11/23/2017  PT End of Session - 11/23/17 0848    Visit Number  5    Number of Visits  16    Date for PT Re-Evaluation  01/04/18    Authorization Type  Medicare & BCBS    PT Start Time  0848    PT Stop Time  0931    PT Time Calculation (min)  43 min    Activity Tolerance  Patient tolerated treatment well    Behavior During Therapy  [facility] for tasks assessed/performed       Past Medical History:  Diagnosis Date  . Cancer [facility])    prostate  . Coronary artery disease   . Hypertension   . Prostate cancer ([place])   . Vision abnormalities     History reviewed. No pertinent surgical history.  There were no vitals filed for this visit.  Subjective Assessment - 11/23/17 0852    Subjective  Pt reporting he is not really having any pain any more, but still frequently fall in or near falling.    Diagnostic tests  Lumbar MRI 10/13/17: Chronic compression fracture L1.  No acute fracture. Subarticular and foraminal narrowing on the left at L1-2 with mild narrowing of the canal. Extraforaminal disc protrusion on the right L3-4 with displacement of the right L3 nerve root and mild spinal stenosis. Mild spinal stenosis L4-5 with moderate subarticular and foraminal stenosis bilaterally.    Patient Stated Goals  "to get me a good walking gait"    Currently in Pain?  No/denies                      [facility] Adult PT Treatment/Exercise - 11/23/17 0848      Ambulation/Gait   Ambulation/Gait Assistance  5: Supervision    Ambulation/Gait Assistance Details  Cues for increased hip and knee flexion and good heel strike to promote imporved foot clearance to reduce fall  risk.    Ambulation Distance (Feet)  270 Feet    Assistive device  None    Gait Pattern  Wide base of support;Decreased hip/knee flexion - left;Decreased hip/knee flexion - right;Decreased dorsiflexion - left;Poor foot clearance - left      Knee/Hip Exercises: Aerobic   Nustep  L5 x 6' (LE only)      Knee/Hip Exercises: Standing   Heel Raises  Both;15 reps;3 seconds    Heel Raises Limitations  emphasis on L ecc lowering    Hip Flexion  Both;20 reps;Knee bent;Stengthening    Hip Flexion Limitations  2#    Hip Abduction  Both;20 reps;Knee straight;Stengthening    Abduction Limitations  2#    Hip Extension  Both;Knee straight;Stengthening    Extension Limitations  2#    Functional Squat  15 reps;3 seconds    Functional Squat Limitations  counter squat      Ankle Exercises: Seated   Other Seated Ankle Exercises  L 4 way ankle with red TB x15               PT Short Term Goals - 11/23/17 5400      PT SHORT TERM GOAL #1   Title  Independent with initial HEP    Status  Achieved        PT Long Term Goals - 11/12/17 1884      PT LONG TERM GOAL #1   Title  Independent with ongoing strengthening +/- balance HEP as indicated    Status  On-going      PT LONG TERM GOAL #2   Title  Overall LE strength >/= 4/5 with L hip and foot at least 4-/5    Status  On-going      PT LONG TERM GOAL #3   Title  Pt will ambulate with normal gait pattern with consistent L foot clearance to decrease risk for falls    Status  On-going      PT LONG TERM GOAL #4   Title  Gait speed >/= 2.62 ft/sec to increase safety with community ambulation    Status  On-going            Plan - 11/23/17 0853    Clinical Impression Statement  Pt reporting ankle exercises at home are gettin easy with yellow TB, noting difficulty only with positioning for ankle inversion, therefore progressed TB resistance to red TB and provided alternative anchoring options for TB with ankle inversion. Introduced  standing exercises for ankle and proximal strengthening and stability with minimal to moderate cueing necessary for proper positioning/technique and pacing. Treatment concluded with gait training to promote normalized gait patttern and imporved foot clearance.    Rehab Potential  Good    PT Treatment/Interventions  Patient/family education;Neuromuscular re-education;Therapeutic exercise;Therapeutic activities;Functional mobility training;Gait training;Balance training;Electrical Stimulation;Moist Heat;Ultrasound;Traction;Manual techniques;Dry needling;Taping;ADLs/Self Care Home Management    Consulted and Agree with Plan of Care  Patient       Patient will benefit from skilled therapeutic intervention in order to improve the following deficits and impairments:  Abnormal gait, Difficulty walking, Decreased strength, Decreased range of motion, Impaired flexibility, Decreased coordination, Decreased balance, Decreased activity tolerance, Increased muscle spasms  Visit Diagnosis: Foot drop, left  Muscle weakness (generalized)  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   Percival Spanish, PT, MPT 11/23/2017, 12:59 PM  [facility] [address]  [place], [place], [address] Phone: [phone]   Fax:  [phone]  Name: Emonte Dieujuste MRN: [id number] Date of Birth: [birth year]

## 2017-11-26 ENCOUNTER — Ambulatory Visit: Payer: Medicare Other

## 2017-11-26 DIAGNOSIS — G8929 Other chronic pain: Secondary | ICD-10-CM

## 2017-11-26 DIAGNOSIS — M545 Low back pain: Secondary | ICD-10-CM

## 2017-11-26 DIAGNOSIS — M6281 Muscle weakness (generalized): Secondary | ICD-10-CM

## 2017-11-26 DIAGNOSIS — M21372 Foot drop, left foot: Secondary | ICD-10-CM

## 2017-11-26 DIAGNOSIS — R296 Repeated falls: Secondary | ICD-10-CM

## 2017-11-26 NOTE — Therapy (Signed)
[facility] [address]  [place], [place], [address] Phone: [phone]   Fax:  [phone]  Physical Therapy Treatment  Patient Details  Name: Isaac Escobar MRN: [id number] Date of Birth: 06-04-1941 Referring Provider: Arlice Colt, MD   Encounter Date: 11/26/2017  PT End of Session - 11/26/17 0850    Visit Number  6    Number of Visits  16    Date for PT Re-Evaluation  01/04/18    Authorization Type  Medicare & BCBS    PT Start Time  0844    PT Stop Time  0923    PT Time Calculation (min)  39 min    Activity Tolerance  Patient tolerated treatment well    Behavior During Therapy  [facility] for tasks assessed/performed       Past Medical History:  Diagnosis Date  . Cancer [facility])    prostate  . Coronary artery disease   . Hypertension   . Prostate cancer ([place])   . Vision abnormalities     History reviewed. No pertinent surgical history.  There were no vitals filed for this visit.  Subjective Assessment - 11/26/17 0851    Subjective  Pt. denies falls since last visit.      Diagnostic tests  Lumbar MRI 10/13/17: Chronic compression fracture L1.  No acute fracture. Subarticular and foraminal narrowing on the left at L1-2 with mild narrowing of the canal. Extraforaminal disc protrusion on the right L3-4 with displacement of the right L3 nerve root and mild spinal stenosis. Mild spinal stenosis L4-5 with moderate subarticular and foraminal stenosis bilaterally.    Patient Stated Goals  "to get me a good walking gait"    Currently in Pain?  No/denies    Pain Score  0-No pain    Multiple Pain Sites  No                      [facility] Adult PT Treatment/Exercise - 11/26/17 0859      Lumbar Exercises: Supine   Bridge  5 seconds;15 reps    Bridge Limitations  + hip ABD isometric with red TB    Bridge with [employer]  15 reps;3 seconds    Bridge with [employer] Limitations  with adduction ball squeeze        Knee/Hip Exercises: Aerobic   Nustep  L5 x 6' (LE only)      Knee/Hip Exercises: Standing   Heel Raises  Both;15 reps;3 seconds    Heel Raises Limitations  B LE's     Hip Flexion  15 reps;Knee straight;Both;Stengthening    Hip Flexion Limitations  with looped yellow TB around ankles; counter     Hip Abduction  15 reps;Knee straight;Both    Abduction Limitations  with looped yellow TB around ankles; counter     Hip Extension  15 reps;Knee straight;Both    Extension Limitations  with looped yellow TB around ankles; counter     Functional Squat  15 reps;3 seconds    Functional Squat Limitations  counter squat    Other Standing Knee Exercises  Side stepping with yellow TB at ankles 4 x 15 ft at counter     Other Standing Knee Exercises  Alternating toe-clears to 8" step with 1 HH assist x 15 reps each side       Knee/Hip Exercises: Seated   Sit to Sand  10 reps;without UE support from [employer]x on mat table  Knee/Hip Exercises: Sidelying   Clams  L clam shell with red TB at knees x 10 reps                PT Short Term Goals - 11/23/17 8937      PT SHORT TERM GOAL #1   Title  Independent with initial HEP    Status  Achieved        PT Long Term Goals - 11/12/17 3428      PT LONG TERM GOAL #1   Title  Independent with ongoing strengthening +/- balance HEP as indicated    Status  On-going      PT LONG TERM GOAL #2   Title  Overall LE strength >/= 4/5 with L hip and foot at least 4-/5    Status  On-going      PT LONG TERM GOAL #3   Title  Pt will ambulate with normal gait pattern with consistent L foot clearance to decrease risk for falls    Status  On-going      PT LONG TERM GOAL #4   Title  Gait speed >/= 2.62 ft/sec to increase safety with community ambulation    Status  On-going            Plan - 11/26/17 0850    Clinical Impression Statement  Isaac Escobar doing well today.  Denies any recent falls since last visit.  Pt. able to tolerate progression of  supine hip strengthening and standing hip strengthening activities well today.  Had most difficulty with 8" toe-clears still demonstrating limited DF strength with LE clearance.  Progressing well toward goals.    PT Treatment/Interventions  Patient/family education;Neuromuscular re-education;Therapeutic exercise;Therapeutic activities;Functional mobility training;Gait training;Balance training;Electrical Stimulation;Moist Heat;Ultrasound;Traction;Manual techniques;Dry needling;Taping;ADLs/Self Care Home Management    Consulted and Agree with Plan of Care  Patient       Patient will benefit from skilled therapeutic intervention in order to improve the following deficits and impairments:  Abnormal gait, Difficulty walking, Decreased strength, Decreased range of motion, Impaired flexibility, Decreased coordination, Decreased balance, Decreased activity tolerance, Increased muscle spasms  Visit Diagnosis: Foot drop, left  Muscle weakness (generalized)  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   Bess Harvest, PTA 11/26/17 12:36 PM  [facility] [address]  [place], [place], [address] Phone: [phone]   Fax:  [phone]  Name: Isaac Escobar MRN: [id number] Date of Birth: [birth year]

## 2017-11-29 ENCOUNTER — Ambulatory Visit: Payer: Medicare Other

## 2017-11-29 DIAGNOSIS — M21372 Foot drop, left foot: Secondary | ICD-10-CM | POA: Diagnosis not present

## 2017-11-29 DIAGNOSIS — R296 Repeated falls: Secondary | ICD-10-CM

## 2017-11-29 DIAGNOSIS — M545 Low back pain, unspecified: Secondary | ICD-10-CM

## 2017-11-29 DIAGNOSIS — G8929 Other chronic pain: Secondary | ICD-10-CM

## 2017-11-29 DIAGNOSIS — M6281 Muscle weakness (generalized): Secondary | ICD-10-CM

## 2017-11-29 NOTE — Therapy (Signed)
[facility] [address]  [place], [place], [address] Phone: [phone]   Fax:  [phone]  Physical Therapy Treatment  Patient Details  Name: Isaac Escobar MRN: [id number] Date of Birth: [birth year] Referring Provider: Arlice Colt, MD   Encounter Date: 11/29/2017  PT End of Session - 11/29/17 0847    Visit Number  7    Number of Visits  16    Date for PT Re-Evaluation  01/04/18    Authorization Type  Medicare & BCBS    PT Start Time  [phone]    PT Stop Time  0928    PT Time Calculation (min)  45 min    Activity Tolerance  Patient tolerated treatment well    Behavior During Therapy  [facility] for tasks assessed/performed       Past Medical History:  Diagnosis Date  . Cancer [facility])    prostate  . Coronary artery disease   . Hypertension   . Prostate cancer ([place])   . Vision abnormalities     History reviewed. No pertinent surgical history.  There were no vitals filed for this visit.  Subjective Assessment - 11/29/17 0846    Subjective  Pt. reporting some L lateral ankle "tightness" today which he reports started this morning.      Diagnostic tests  Lumbar MRI 10/13/17: Chronic compression fracture L1.  No acute fracture. Subarticular and foraminal narrowing on the left at L1-2 with mild narrowing of the canal. Extraforaminal disc protrusion on the right L3-4 with displacement of the right L3 nerve root and mild spinal stenosis. Mild spinal stenosis L4-5 with moderate subarticular and foraminal stenosis bilaterally.    Patient Stated Goals  "to get me a good walking gait"    Currently in Pain?  No/denies    Pain Score  0-No pain    Multiple Pain Sites  No                      [facility] Adult PT Treatment/Exercise - 11/29/17 0901      High Level Balance   High Level Balance Activities  Negotiating over obstacles    High Level Balance Comments  Bolster step over focusing on increased hip/knee flexion  for improved LE clearance 10 x 2 bolsters; 1 HH assist; Cues required to avoid circumduction       Knee/Hip Exercises: Stretches   Gastroc Stretch  Left;30 seconds    Gastroc Stretch Limitations  leaning into wall  reported "relief" from L calf tightness following this       Knee/Hip Exercises: Aerobic   Stationary Bike  Recumbent bike: lvl 1, 6 min       Knee/Hip Exercises: Standing   Heel Raises  Both;15 reps;3 seconds    Heel Raises Limitations  B LE's - cues for slow eccentric required     Forward Step Up  Right;Left;10 reps;Step Height: 6"    Forward Step Up Limitations  1 HH assist; intermittent chair support with other UE     Functional Squat  15 reps;3 seconds    Functional Squat Limitations  counter squat    Other Standing Knee Exercises  Alternating toe-clears forward, lateral 2# at ankles to 8" step with 1 HH assist x 15 reps each side  Some cueing required for increased hip/knee flexion       Knee/Hip Exercises: Seated   Long Arc Quad  Right;Left;10 reps;2 sets Good overall control with 2#; 2 sets  Long Arc Quad Limitations  2#       Manual Therapy   Manual Therapy  Soft tissue mobilization    Manual therapy comments  seated     Soft tissue mobilization  STM to L lateral, medial, posterior ankle and lower calf; no significant TTP                PT Short Term Goals - 11/23/17 9163      PT SHORT TERM GOAL #1   Title  Independent with initial HEP    Status  Achieved        PT Long Term Goals - 11/12/17 8466      PT LONG TERM GOAL #1   Title  Independent with ongoing strengthening +/- balance HEP as indicated    Status  On-going      PT LONG TERM GOAL #2   Title  Overall LE strength >/= 4/5 with L hip and foot at least 4-/5    Status  On-going      PT LONG TERM GOAL #3   Title  Pt will ambulate with normal gait pattern with consistent L foot clearance to decrease risk for falls    Status  On-going      PT LONG TERM GOAL #4   Title  Gait speed  >/= 2.62 ft/sec to increase safety with community ambulation    Status  On-going            Plan - 11/29/17 0848    Clinical Impression Statement  Isaac Escobar with complaint of some L posterior ankle/calf tightness which started this morning, which has been bothering him while walking today.  Relief with calf stretching to start treatment and pt. pain free for remainder of session.  Isaac Escobar tolerated LE strengthening activities focused on improving LE clearance with gait well today.  Most difficulty with bolster step overs requiring [place] assist from therapist for stability.  Ended treatment pain free.  Will continue to progress toward goals.    PT Treatment/Interventions  Patient/family education;Neuromuscular re-education;Therapeutic exercise;Therapeutic activities;Functional mobility training;Gait training;Balance training;Electrical Stimulation;Moist Heat;Ultrasound;Traction;Manual techniques;Dry needling;Taping;ADLs/Self Care Home Management    Consulted and Agree with Plan of Care  Patient       Patient will benefit from skilled therapeutic intervention in order to improve the following deficits and impairments:  Abnormal gait, Difficulty walking, Decreased strength, Decreased range of motion, Impaired flexibility, Decreased coordination, Decreased balance, Decreased activity tolerance, Increased muscle spasms  Visit Diagnosis: Foot drop, left  Muscle weakness (generalized)  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   Bess Harvest, PTA 11/29/17 10:57 AM  [facility] [address]  [place], [place], [address] Phone: [phone]   Fax:  [phone]  Name: Isaac Escobar MRN: [id number] Date of Birth: [birth year]

## 2017-12-03 ENCOUNTER — Ambulatory Visit: Payer: Medicare Other

## 2017-12-03 DIAGNOSIS — R296 Repeated falls: Secondary | ICD-10-CM

## 2017-12-03 DIAGNOSIS — M545 Low back pain, unspecified: Secondary | ICD-10-CM

## 2017-12-03 DIAGNOSIS — G8929 Other chronic pain: Secondary | ICD-10-CM

## 2017-12-03 DIAGNOSIS — M6281 Muscle weakness (generalized): Secondary | ICD-10-CM

## 2017-12-03 DIAGNOSIS — M21372 Foot drop, left foot: Secondary | ICD-10-CM

## 2017-12-03 NOTE — Therapy (Signed)
[facility] [address]  [place], [place], [address] Phone: [phone]   Fax:  [phone]  Physical Therapy Treatment  Patient Details  Name: Isaac Escobar MRN: [id number] Date of Birth: 01/02/1941 Referring Provider: Arlice Colt, MD   Encounter Date: 12/03/2017  PT End of Session - 12/03/17 0859    Visit Number  8    Number of Visits  16    Date for PT Re-Evaluation  01/04/18    Authorization Type  Medicare & BCBS    PT Start Time  [phone]    PT Stop Time  0925    PT Time Calculation (min)  42 min    Activity Tolerance  Patient tolerated treatment well    Behavior During Therapy  [facility] for tasks assessed/performed       Past Medical History:  Diagnosis Date  . Cancer [facility])    prostate  . Coronary artery disease   . Hypertension   . Prostate cancer ([place])   . Vision abnormalities     History reviewed. No pertinent surgical history.  There were no vitals filed for this visit.  Subjective Assessment - 12/03/17 0939    Subjective  Pt. reporting he feels he is getting stronger and "walking better".      Diagnostic tests  Lumbar MRI 10/13/17: Chronic compression fracture L1.  No acute fracture. Subarticular and foraminal narrowing on the left at L1-2 with mild narrowing of the canal. Extraforaminal disc protrusion on the right L3-4 with displacement of the right L3 nerve root and mild spinal stenosis. Mild spinal stenosis L4-5 with moderate subarticular and foraminal stenosis bilaterally.    Patient Stated Goals  "to get me a good walking gait"    Currently in Pain?  No/denies    Pain Score  0-No pain    Multiple Pain Sites  No                      [facility] Adult PT Treatment/Exercise - 12/03/17 0858      Self-Care   Self-Care  Other Self-Care Comments    Other Self-Care Comments   Discussed current HEP performance and need to have UE support with all standing activities; encouraged pt. to only  perform activities issued to him via paper handout and avoid adding new activities (which he has been performing without UE support in standing) for safety       Lumbar Exercises: Supine   Bent Knee Raise  3 seconds;20 reps    Bent Knee Raise Limitations  red looped TB at knees + brace marching    Bridge  5 seconds;15 reps    Bridge Limitations  + hip ABD isometric with red TB      Knee/Hip Exercises: Aerobic   Nustep  L5 x 7' (LE only)      Knee/Hip Exercises: Standing   Heel Raises  20 reps;Both;3 seconds    Heel Raises Limitations  B LE's - cues for slow eccentric required     Forward Step Up  Right;Left;Step Height: 6";15 reps;Hand Hold: 1    Forward Step Up Limitations  1 HH assist     Other Standing Knee Exercises  Side stepping with red TB at ankles 4 x 15 ft at counter     Other Standing Knee Exercises  Alternating toe-clears forward, lateral 2# at ankles to 8" step x 15 reps each side  Cues required to avoid circumduction; supervision  Knee/Hip Exercises: Sidelying   Clams  B clam shell with red TB x 15 reps              PT Education - 12/03/17 0929    Education provided  Yes    Education Details  3-way hip kicker, heel raise    Person(s) Educated  Patient    Methods  Explanation;Verbal cues;Handout    Comprehension  Verbalized understanding;Verbal cues required;Need further instruction       PT Short Term Goals - 11/23/17 2694      PT SHORT TERM GOAL #1   Title  Independent with initial HEP    Status  Achieved        PT Long Term Goals - 11/12/17 8546      PT LONG TERM GOAL #1   Title  Independent with ongoing strengthening +/- balance HEP as indicated    Status  On-going      PT LONG TERM GOAL #2   Title  Overall LE strength >/= 4/5 with L hip and foot at least 4-/5    Status  On-going      PT LONG TERM GOAL #3   Title  Pt will ambulate with normal gait pattern with consistent L foot clearance to decrease risk for falls    Status  On-going       PT LONG TERM GOAL #4   Title  Gait speed >/= 2.62 ft/sec to increase safety with community ambulation    Status  On-going            Plan - 12/03/17 0900    Clinical Impression Statement  Pt. reporting he is "walking better" at home.  Denies falling since last visit.  Tolerated less UE support with standing strengthening/balance activities today.  Seems to be improving L LE clearance with stepping activities.  Did have to speak with pt. today regarding need for UE support with standing HEP activities at home for improved safety.  HEP updated. Progressing well toward goals.      PT Treatment/Interventions  Patient/family education;Neuromuscular re-education;Therapeutic exercise;Therapeutic activities;Functional mobility training;Gait training;Balance training;Electrical Stimulation;Moist Heat;Ultrasound;Traction;Manual techniques;Dry needling;Taping;ADLs/Self Care Home Management    Consulted and Agree with Plan of Care  Patient       Patient will benefit from skilled therapeutic intervention in order to improve the following deficits and impairments:  Abnormal gait, Difficulty walking, Decreased strength, Decreased range of motion, Impaired flexibility, Decreased coordination, Decreased balance, Decreased activity tolerance, Increased muscle spasms  Visit Diagnosis: Foot drop, left  Muscle weakness (generalized)  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   Bess Harvest, PTA 12/03/17 10:54 AM  [facility] [address]  [place], [place], [address] Phone: [phone]   Fax:  [phone]  Name: Isaac Escobar MRN: [id number] Date of Birth: [birth year]

## 2017-12-06 ENCOUNTER — Encounter: Payer: Self-pay | Admitting: Physical Therapy

## 2017-12-06 ENCOUNTER — Ambulatory Visit: Payer: Medicare Other | Admitting: Physical Therapy

## 2017-12-06 DIAGNOSIS — M21372 Foot drop, left foot: Secondary | ICD-10-CM

## 2017-12-06 DIAGNOSIS — M6281 Muscle weakness (generalized): Secondary | ICD-10-CM

## 2017-12-06 DIAGNOSIS — G8929 Other chronic pain: Secondary | ICD-10-CM

## 2017-12-06 DIAGNOSIS — M545 Low back pain: Secondary | ICD-10-CM

## 2017-12-06 DIAGNOSIS — R296 Repeated falls: Secondary | ICD-10-CM

## 2017-12-06 NOTE — Therapy (Signed)
[facility] [address]  [place], [place], [address] Phone: [phone]   Fax:  [phone]  Physical Therapy Treatment  Patient Details  Name: Raj Landress MRN: [id number] Date of Birth: Mar 12, 1941 Referring Provider: Arlice Colt, MD   Encounter Date: 12/06/2017  PT End of Session - 12/06/17 0848    Visit Number  9    Number of Visits  16    Date for PT Re-Evaluation  01/04/18    Authorization Type  Medicare & BCBS    PT Start Time  0848    PT Stop Time  0930    PT Time Calculation (min)  42 min    Activity Tolerance  Patient tolerated treatment well    Behavior During Therapy  [facility] for tasks assessed/performed       Past Medical History:  Diagnosis Date  . Cancer [facility])    prostate  . Coronary artery disease   . Hypertension   . Prostate cancer ([place])   . Vision abnormalities     History reviewed. No pertinent surgical history.  There were no vitals filed for this visit.  Subjective Assessment - 12/06/17 0852    Subjective  Pt feeling steadier and reports he has not had any further falls in the last 2 weeks.    Diagnostic tests  Lumbar MRI 10/13/17: Chronic compression fracture L1.  No acute fracture. Subarticular and foraminal narrowing on the left at L1-2 with mild narrowing of the canal. Extraforaminal disc protrusion on the right L3-4 with displacement of the right L3 nerve root and mild spinal stenosis. Mild spinal stenosis L4-5 with moderate subarticular and foraminal stenosis bilaterally.    Patient Stated Goals  "to get me a good walking gait"    Currently in Pain?  No/denies                      [facility] Adult PT Treatment/Exercise - 12/06/17 0848      Knee/Hip Exercises: Aerobic   Recumbent Bike  L1 x 6'      Knee/Hip Exercises: Machines for Strengthening   Cybex Leg Press  L ankle press 20# x15      Knee/Hip Exercises: Standing   Heel Raises  Both;15 reps;3 seconds;2 sets     Heel Raises Limitations  negative heel on back of UBE - 2nd set with emphasis on L ecc lowering    Hip Flexion  Both;15 reps;Knee straight;Stengthening    Hip Flexion Limitations  looped red TB; UE support on counter    Hip Abduction  Both;15 reps;Knee straight;Stengthening    Abduction Limitations  looped red TB; UE support on counter    Hip Extension  Both;15 reps;Knee straight;Stengthening    Extension Limitations  looped red TB; UE support on counter    SLS with Vectors  R/L SLS with opp LE toe tap to colored discs ant/lat/post; light UE support on counter    Other Standing Knee Exercises  B side stepping with red TB at forefoot 4 x 83ft (UE support on counter)               PT Short Term Goals - 11/23/17 0853      PT SHORT TERM GOAL #1   Title  Independent with initial HEP    Status  Achieved        PT Long Term Goals - 11/12/17 0852      PT LONG TERM GOAL #1  Title  Independent with ongoing strengthening +/- balance HEP as indicated    Status  On-going      PT LONG TERM GOAL #2   Title  Overall LE strength >/= 4/5 with L hip and foot at least 4-/5    Status  On-going      PT LONG TERM GOAL #3   Title  Pt will ambulate with normal gait pattern with consistent L foot clearance to decrease risk for falls    Status  On-going      PT LONG TERM GOAL #4   Title  Gait speed >/= 2.62 ft/sec to increase safety with community ambulation    Status  On-going            Plan - 12/06/17 0854    Clinical Impression Statement  Jeffie feelig like calf stretches and strengthening exercises have made walking easier. L ankle functional ROM and strength improving with decreased foot drop and improved toe clearance during gait, but pt continues to demonstrate wide BOS with decreased stability with SLS activities.    PT Treatment/Interventions  Patient/family education;Neuromuscular re-education;Therapeutic exercise;Therapeutic activities;Functional mobility training;Gait  training;Balance training;Electrical Stimulation;Moist Heat;Ultrasound;Traction;Manual techniques;Dry needling;Taping;ADLs/Self Care Home Management    Consulted and Agree with Plan of Care  Patient       Patient will benefit from skilled therapeutic intervention in order to improve the following deficits and impairments:  Abnormal gait, Difficulty walking, Decreased strength, Decreased range of motion, Impaired flexibility, Decreased coordination, Decreased balance, Decreased activity tolerance, Increased muscle spasms  Visit Diagnosis: Foot drop, left  Muscle weakness (generalized)  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   Percival Spanish, PT, MPT 12/06/2017, 1:10 PM  [facility] [address]  [place], [place], [address] Phone: [phone]   Fax:  [phone]  Name: Aron Inge MRN: [id number] Date of Birth: 06-13-1941

## 2017-12-10 ENCOUNTER — Ambulatory Visit: Payer: Medicare Other

## 2017-12-10 DIAGNOSIS — R296 Repeated falls: Secondary | ICD-10-CM

## 2017-12-10 DIAGNOSIS — M21372 Foot drop, left foot: Secondary | ICD-10-CM

## 2017-12-10 DIAGNOSIS — M6281 Muscle weakness (generalized): Secondary | ICD-10-CM

## 2017-12-10 DIAGNOSIS — M545 Low back pain: Secondary | ICD-10-CM

## 2017-12-10 DIAGNOSIS — G8929 Other chronic pain: Secondary | ICD-10-CM

## 2017-12-10 NOTE — Therapy (Signed)
[facility] [address]  [place], [place], [address] Phone: [phone]   Fax:  [phone]  Physical Therapy Treatment  Patient Details  Name: Isaac Escobar MRN: [id number] Date of Birth: 1941/07/27 Referring Provider: Arlice Colt, MD   Encounter Date: 12/10/2017  PT End of Session - 12/10/17 0848    Visit Number  10    Number of Visits  16    Date for PT Re-Evaluation  01/04/18    Authorization Type  Medicare & BCBS    PT Start Time  0844    PT Stop Time  0925    PT Time Calculation (min)  41 min    Activity Tolerance  Patient tolerated treatment well    Behavior During Therapy  [facility] for tasks assessed/performed       Past Medical History:  Diagnosis Date  . Cancer [facility])    prostate  . Coronary artery disease   . Hypertension   . Prostate cancer ([place])   . Vision abnormalities     No past surgical history on file.  There were no vitals filed for this visit.  Subjective Assessment - 12/10/17 0848    Subjective  Pt. doing well today with no new complaints.      Diagnostic tests  Lumbar MRI 10/13/17: Chronic compression fracture L1.  No acute fracture. Subarticular and foraminal narrowing on the left at L1-2 with mild narrowing of the canal. Extraforaminal disc protrusion on the right L3-4 with displacement of the right L3 nerve root and mild spinal stenosis. Mild spinal stenosis L4-5 with moderate subarticular and foraminal stenosis bilaterally.    Patient Stated Goals  "to get me a good walking gait"    Currently in Pain?  No/denies    Pain Score  0-No pain    Multiple Pain Sites  No         [facility] PT Assessment - 12/10/17 0851      Strength   Strength Assessment Site  Hip;Knee;Ankle    Right/Left Hip  Right;Left    Right Hip Flexion  4-/5    Right Hip Extension  4/5    Right Hip External Rotation   4/5    Right Hip Internal Rotation  4/5    Right Hip ABduction  4/5    Right Hip ADduction   4/5    Left Hip Flexion  4/5    Left Hip Extension  4-/5    Left Hip External Rotation  4/5    Left Hip Internal Rotation  4/5    Left Hip ABduction  4-/5    Left Hip ADduction  4-/5    Right/Left Knee  Right;Left    Right Knee Flexion  4+/5    Right Knee Extension  5/5    Left Knee Flexion  4+/5    Left Knee Extension  4+/5    Right/Left Ankle  Right;Left    Right Ankle Dorsiflexion  4+/5    Right Ankle Plantar Flexion  4/5    Right Ankle Inversion  4+/5    Right Ankle Eversion  4+/5    Left Ankle Dorsiflexion  4-/5    Left Ankle Plantar Flexion  4/5    Left Ankle Inversion  4/5    Left Ankle Eversion  4/5                  [facility] Adult PT Treatment/Exercise - 12/10/17 0850      Ambulation/Gait  Ambulation/Gait Assistance  5: Supervision    Ambulation/Gait Assistance Details  Cues provided for increased hip/knee flexion and for B heel strike    Gait Pattern  Wide base of support;Decreased hip/knee flexion - left;Decreased hip/knee flexion - right;Decreased dorsiflexion - left;Poor foot clearance - left    Gait velocity  3.71 ft/sec       Knee/Hip Exercises: Aerobic   Nustep  L6 x 6' (LE only)      Knee/Hip Exercises: Standing   Heel Raises  20 reps;3 seconds    Heel Raises Limitations  at counter; 3" negative     Functional Squat  15 reps;3 seconds Cues required for proper pacing     Functional Squat Limitations  TRX     SLS with Vectors  B SLS with opposite LE toe-touch to rubber balls 2 x 20 sec each way; 1 light UE support on counter       Knee/Hip Exercises: Seated   Marching  Right;Left;15 reps cues required to prevent pt. from "leaning back"    Marching Limitations  red looped TB at knees                PT Short Term Goals - 11/23/17 1610      PT SHORT TERM GOAL #1   Title  Independent with initial HEP    Status  Achieved        PT Long Term Goals - 12/10/17 0905      PT LONG TERM GOAL #1   Title  Independent with ongoing  strengthening +/- balance HEP as indicated    Status  Partially Met met for current HEP      PT LONG TERM GOAL #2   Title  Overall LE strength >/= 4/5 with L hip and foot at least 4-/5    Status  Partially Met      PT LONG TERM GOAL #3   Title  Pt will ambulate with normal gait pattern with consistent L foot clearance to decrease risk for falls    Status  On-going Pt. still demonstrating decreased L LE clearance with gait       PT LONG TERM GOAL #4   Title  Gait speed >/= 2.62 ft/sec to increase safety with community ambulation    Status  Achieved            Plan - 12/10/17 0849    Clinical Impression Statement  Doyce making good progress with therapy thus far.  Able to partially meet strength goal with MMT today demonstrating good improvement in overall L LE strength.  Pt. ambulating with improved L LE clearance and able to increase gait speed to 3.45f/sec today.  Denies recent falls.  Does continue to require some cueing for proper pacing and technique with therex.  Reports performing standing HEP at counter top daily and progressing well at this point.      PT Treatment/Interventions  Patient/family education;Neuromuscular re-education;Therapeutic exercise;Therapeutic activities;Functional mobility training;Gait training;Balance training;Electrical Stimulation;Moist Heat;Ultrasound;Traction;Manual techniques;Dry needling;Taping;ADLs/Self Care Home Management    Consulted and Agree with Plan of Care  Patient       Patient will benefit from skilled therapeutic intervention in order to improve the following deficits and impairments:  Abnormal gait, Difficulty walking, Decreased strength, Decreased range of motion, Impaired flexibility, Decreased coordination, Decreased balance, Decreased activity tolerance, Increased muscle spasms  Visit Diagnosis: Foot drop, left  Muscle weakness (generalized)  Chronic right-sided low back pain without sciatica  Repeated  falls  Problem List There are no active problems to display for this patient.   Bess Harvest, PTA 12/10/17 9:48 AM  [facility] [address]   [place], [place], [address] Phone: [phone]   Fax:  [phone]  Name: Constance Hackenberg MRN: [id number] Date of Birth: August 14, 1941

## 2017-12-13 ENCOUNTER — Ambulatory Visit: Payer: Medicare Other

## 2017-12-13 DIAGNOSIS — M6281 Muscle weakness (generalized): Secondary | ICD-10-CM

## 2017-12-13 DIAGNOSIS — M21372 Foot drop, left foot: Secondary | ICD-10-CM

## 2017-12-13 DIAGNOSIS — G8929 Other chronic pain: Secondary | ICD-10-CM

## 2017-12-13 DIAGNOSIS — R296 Repeated falls: Secondary | ICD-10-CM

## 2017-12-13 DIAGNOSIS — M545 Low back pain: Secondary | ICD-10-CM

## 2017-12-13 NOTE — Therapy (Signed)
[name] [address]  [place], [place], [address] Phone: [phone]   Fax:  [phone]  Physical Therapy Treatment  Patient Details  Name: Isaac Escobar MRN: [id number] Date of Birth: 1941-02-22 Referring Provider: Arlice Colt, MD   Encounter Date: 12/13/2017  PT End of Session - 12/13/17 1020    Visit Number  11    Number of Visits  16    Date for PT Re-Evaluation  01/04/18    Authorization Type  Medicare & BCBS    PT Start Time  1014    PT Stop Time  1059    PT Time Calculation (min)  45 min    Activity Tolerance  Patient tolerated treatment well    Behavior During Therapy  [facility] for tasks assessed/performed       Past Medical History:  Diagnosis Date  . Cancer [facility])    prostate  . Coronary artery disease   . Hypertension   . Prostate cancer ([place])   . Vision abnormalities     History reviewed. No pertinent surgical history.  There were no vitals filed for this visit.  Subjective Assessment - 12/13/17 1021    Subjective  Pt. reporting, "I feel like my legs are getting stronger".  Denies recent falls.      Diagnostic tests  Lumbar MRI 10/13/17: Chronic compression fracture L1.  No acute fracture. Subarticular and foraminal narrowing on the left at L1-2 with mild narrowing of the canal. Extraforaminal disc protrusion on the right L3-4 with displacement of the right L3 nerve root and mild spinal stenosis. Mild spinal stenosis L4-5 with moderate subarticular and foraminal stenosis bilaterally.    Patient Stated Goals  "to get me a good walking gait"    Currently in Pain?  No/denies    Pain Score  0-No pain    Multiple Pain Sites  No                      [facility] Adult PT Treatment/Exercise - 12/13/17 1030      High Level Balance   High Level Balance Activities  Negotiating over obstacles;Side stepping    High Level Balance Comments  Bolster step over forwards, lateral focusing on  increased hip/knee flexion for improved LE clearance 6 x 4 bolsters; 1 HH assist; Cues required to avoid circumduction       Knee/Hip Exercises: Aerobic   Recumbent Bike  L2 x 7'      Knee/Hip Exercises: Standing   Wall Squat  10 reps;3 seconds    Wall Squat Limitations  leaning on orange p-ball     SLS with Vectors  B SLS with opposite LE toe-touch to rubber balls 2 x 25 sec each way; 1 light UE support on counter     Other Standing Knee Exercises  B side stepping with red TB at atnkles 2 x 20 ft; 1 light HH assist from therapist     Other Standing Knee Exercises  Alternating toe-clears forward 3# at ankles to 8" step x 15 reps each side       Knee/Hip Exercises: Seated   Long Arc Quad  Right;Left;10 reps;1 set;Weights    Long Arc Quad Limitations  3#      Ankle Exercises: Seated   Other Seated Ankle Exercises  L 4 way ankle with red TB x20               PT Short Term Goals -  11/23/17 0853      PT SHORT TERM GOAL #1   Title  Independent with initial HEP    Status  Achieved        PT Long Term Goals - 12/10/17 0905      PT LONG TERM GOAL #1   Title  Independent with ongoing strengthening +/- balance HEP as indicated    Status  Partially Met met for current HEP      PT LONG TERM GOAL #2   Title  Overall LE strength >/= 4/5 with L hip and foot at least 4-/5    Status  Partially Met      PT LONG TERM GOAL #3   Title  Pt will ambulate with normal gait pattern with consistent L foot clearance to decrease risk for falls    Status  On-going Pt. still demonstrating decreased L LE clearance with gait       PT LONG TERM GOAL #4   Title  Gait speed >/= 2.62 ft/sec to increase safety with community ambulation    Status  Achieved            Plan - 12/13/17 1020    Clinical Impression Statement  Aragorn reporting, "I feel my legs are getting stronger", and denies recent falls.  Able to progress to 3# LAQ today and progress reps with 4-way ankle strengthening with red  TB resistance.  Requires frequent cueing throughout treatment to stay on task and for proper pacing and LE clearance.  Pt. with tendency to stop activities to talk with therapist requiring cueing to stay on task.  Standing activities focused on improving hip/knee flexion for improved LE clearance with gait.  Pt. still with tendency to ambulate with wide BOS and poor LE clearance and would benefit from further skilled therapy to improve safety with functional activities.      PT Treatment/Interventions  Patient/family education;Neuromuscular re-education;Therapeutic exercise;Therapeutic activities;Functional mobility training;Gait training;Balance training;Electrical Stimulation;Moist Heat;Ultrasound;Traction;Manual techniques;Dry needling;Taping;ADLs/Self Care Home Management    Consulted and Agree with Plan of Care  Patient       Patient will benefit from skilled therapeutic intervention in order to improve the following deficits and impairments:  Abnormal gait, Difficulty walking, Decreased strength, Decreased range of motion, Impaired flexibility, Decreased coordination, Decreased balance, Decreased activity tolerance, Increased muscle spasms  Visit Diagnosis: Foot drop, left  Muscle weakness (generalized)  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   Isaac Escobar, PTA 12/13/17 11:17 AM  [facility] [address]  [place], [place], [address] Phone: [phone]   Fax:  [phone]  Name: Isaac Escobar MRN: [id number] Date of Birth: Jul 31, 1941

## 2017-12-17 ENCOUNTER — Ambulatory Visit: Payer: Medicare Other | Admitting: Physical Therapy

## 2017-12-17 ENCOUNTER — Encounter: Payer: Self-pay | Admitting: Physical Therapy

## 2017-12-17 DIAGNOSIS — M21372 Foot drop, left foot: Secondary | ICD-10-CM | POA: Diagnosis not present

## 2017-12-17 DIAGNOSIS — G8929 Other chronic pain: Secondary | ICD-10-CM

## 2017-12-17 DIAGNOSIS — M6281 Muscle weakness (generalized): Secondary | ICD-10-CM

## 2017-12-17 DIAGNOSIS — M545 Low back pain: Secondary | ICD-10-CM

## 2017-12-17 DIAGNOSIS — R296 Repeated falls: Secondary | ICD-10-CM

## 2017-12-17 NOTE — Therapy (Addendum)
[facility] [address]  [place], [place], [address] Phone: [phone]   Fax:  [phone]  Physical Therapy Treatment  Patient Details  Name: Isaac Escobar MRN: [id number] Date of Birth: 1941/07/22 Referring Provider: Arlice Colt, MD   Encounter Date: 12/17/2017  PT End of Session - 12/17/17 0835    Visit Number  12    Number of Visits  16    Date for PT Re-Evaluation  01/04/18    Authorization Type  Medicare & BCBS    PT Start Time  0835    PT Stop Time  0923    PT Time Calculation (min)  48 min    Equipment Utilized During Treatment  Gait belt    Activity Tolerance  Patient tolerated treatment well    Behavior During Therapy  [facility] for tasks assessed/performed       Past Medical History:  Diagnosis Date  . Cancer [facility])    prostate  . Coronary artery disease   . Hypertension   . Prostate cancer ([place])   . Vision abnormalities     History reviewed. No pertinent surgical history.  There were no vitals filed for this visit.  Subjective Assessment - 12/17/17 0835    Subjective  Pt. states doing well with no complaints. Pt. reported that he has not had any falls within the last 3 weeks. No pain currently, just been feeling unstable at times but not as often as before. HEP is going good and still completing it 1x/day.     Diagnostic tests  Lumbar MRI 10/13/17: Chronic compression fracture L1.  No acute fracture. Subarticular and foraminal narrowing on the left at L1-2 with mild narrowing of the canal. Extraforaminal disc protrusion on the right L3-4 with displacement of the right L3 nerve root and mild spinal stenosis. Mild spinal stenosis L4-5 with moderate subarticular and foraminal stenosis bilaterally.    Patient Stated Goals  "to get me a good walking gait"    Currently in Pain?  No/denies    Pain Score  0-No pain                No data recorded       [facility] Adult PT Treatment/Exercise -  12/17/17 0835      Ambulation/Gait   Ambulation/Gait  Yes    Ambulation/Gait Assistance  5: Supervision    Ambulation/Gait Assistance Details  Cues for increased hip/knee flexion, proper heel strike, and avoidance of circumduction of L LE to improve gait pattern and reduce fall risk     Ambulation Distance (Feet)  360 Feet    Gait Pattern  Decreased hip/knee flexion - left;Decreased dorsiflexion - left;Poor foot clearance - left      High Level Balance   High Level Balance Activities  Side stepping;Negotiating over obstacles    High Level Balance Comments  ladder side stepping x 3; horizontal yoga mat forward stepovers x 6 cues to avoid circumduction, increase L hip/knee flexion      Knee/Hip Exercises: Aerobic   Recumbent Bike  L2 x6'      Knee/Hip Exercises: Standing   SLS with Vectors  L SLS with opposite LE toe-touch to balance pebbles (anterior/posterior/lateral)       Ankle Exercises: Seated   BAPS  Level 3;Sitting;15 reps;Weight completed PF/DF    BAPS Weights (lbs)  2 1/2 lbs (Post. pole)     Other Seated Ankle Exercises  L 4 way ankle w/ red TB x  15               PT Short Term Goals - 11/23/17 5929      PT SHORT TERM GOAL #1   Title  Independent with initial HEP    Status  Achieved        PT Long Term Goals - 12/10/17 0905      PT LONG TERM GOAL #1   Title  Independent with ongoing strengthening +/- balance HEP as indicated    Status  Partially Met met for current HEP      PT LONG TERM GOAL #2   Title  Overall LE strength >/= 4/5 with L hip and foot at least 4-/5    Status  Partially Met      PT LONG TERM GOAL #3   Title  Pt will ambulate with normal gait pattern with consistent L foot clearance to decrease risk for falls    Status  On-going Pt. still demonstrating decreased L LE clearance with gait       PT LONG TERM GOAL #4   Title  Gait speed >/= 2.62 ft/sec to increase safety with community ambulation    Status  Achieved            Plan  - 12/17/17 0835    Clinical Impression Statement  Isaac Escobar reported that there are still times when feels slighty unstable, however not as often prior to starting therapy. Denied any falls within the last 3 weeks. Continued 4 way ankle strengthening with red TB resistance. Implemented BAPS to further improve strength/control of the L ankle, pt. lacked control at times with forward/backward motion. Continued with gait/balance training to further improve L hip/knee flexion and foot clearance for gait. Pt. required cueing throughout treatment for technique and avoidance of substitutions.     PT Treatment/Interventions  Patient/family education;Neuromuscular re-education;Therapeutic exercise;Therapeutic activities;Functional mobility training;Gait training;Balance training;Electrical Stimulation;Moist Heat;Ultrasound;Traction;Manual techniques;Dry needling;Taping;ADLs/Self Care Home Management    Consulted and Agree with Plan of Care  Patient       Patient will benefit from skilled therapeutic intervention in order to improve the following deficits and impairments:  Abnormal gait, Difficulty walking, Decreased strength, Decreased range of motion, Impaired flexibility, Decreased coordination, Decreased balance, Decreased activity tolerance, Increased muscle spasms  Visit Diagnosis: Foot drop, left  Muscle weakness (generalized)  Repeated falls     Problem List There are no active problems to display for this patient.   Baldomero Lamy, SPT 12/17/2017, 1:07 PM  [facility] [address]  [place], [place], [address] Phone: [phone]   Fax:  [phone]  Name: Isaac Escobar MRN: [id number] Date of Birth: Oct 04, 1940

## 2017-12-20 ENCOUNTER — Encounter: Payer: Self-pay | Admitting: Physical Therapy

## 2017-12-20 ENCOUNTER — Ambulatory Visit: Payer: Medicare Other | Admitting: Physical Therapy

## 2017-12-20 DIAGNOSIS — M545 Low back pain, unspecified: Secondary | ICD-10-CM

## 2017-12-20 DIAGNOSIS — M21372 Foot drop, left foot: Secondary | ICD-10-CM

## 2017-12-20 DIAGNOSIS — R296 Repeated falls: Secondary | ICD-10-CM

## 2017-12-20 DIAGNOSIS — M6281 Muscle weakness (generalized): Secondary | ICD-10-CM

## 2017-12-20 DIAGNOSIS — G8929 Other chronic pain: Secondary | ICD-10-CM

## 2017-12-20 NOTE — Therapy (Signed)
[facility] [address]  [place], [place], [address] Phone: [phone]   Fax:  [phone]  Physical Therapy Treatment  Patient Details  Name: Isaac Escobar MRN: [id number] Date of Birth: Dec 23, 1940 Referring Provider: Arlice Colt, MD   Encounter Date: 12/20/2017  PT End of Session - 12/20/17 0931    Visit Number  13    Number of Visits  16    Date for PT Re-Evaluation  01/04/18    Authorization Type  Medicare & BCBS    PT Start Time  0931    PT Stop Time  1017    PT Time Calculation (min)  46 min    Equipment Utilized During Treatment  Gait belt    Activity Tolerance  Patient tolerated treatment well    Behavior During Therapy  [facility] for tasks assessed/performed       Past Medical History:  Diagnosis Date  . Cancer [facility])    prostate  . Coronary artery disease   . Hypertension   . Prostate cancer ([place])   . Vision abnormalities     History reviewed. No pertinent surgical history.  There were no vitals filed for this visit.  Subjective Assessment - 12/20/17 0931    Subjective  Pt. states feeling no worse, slighty better. States that his pain has eased off and not experiencing any currently. No falls or close calls. Still feels unsteady. Went to granddaughters game and didnt have any issues with walking to softball field, just took him longer than he wanted because of that unsteady feeling.     Diagnostic tests  Lumbar MRI 10/13/17: Chronic compression fracture L1.  No acute fracture. Subarticular and foraminal narrowing on the left at L1-2 with mild narrowing of the canal. Extraforaminal disc protrusion on the right L3-4 with displacement of the right L3 nerve root and mild spinal stenosis. Mild spinal stenosis L4-5 with moderate subarticular and foraminal stenosis bilaterally.    Patient Stated Goals  "to get me a good walking gait"    Currently in Pain?  No/denies    Pain Score  0-No pain                 No data recorded       [facility] Adult PT Treatment/Exercise - 12/20/17 0931      High Level Balance   High Level Balance Activities  Negotiating over obstacles    High Level Balance Comments  yoga mat step over x 4 w/ cues for hip/knee flexion; tandem stance w/ chair support as needed 30" x 3 reps       Knee/Hip Exercises: Aerobic   Recumbent Bike  L2x 6'      Knee/Hip Exercises: Standing   SLS  L SLS x 2 reps 45"  w/ chair support    SLS with Vectors  L SLS w/ oppposite LE toe touch to colored discs (ant/med/lat)       Ankle Exercises: Seated   BAPS  Level 3;Sitting;15 reps;Weight completed PF/DF    BAPS Weights (lbs)  2 1/2 lbs (Post. pole)     Other Seated Ankle Exercises  L 4 way ankle x 15 reps w/ green TB               PT Short Term Goals - 11/23/17 9373      PT SHORT TERM GOAL #1   Title  Independent with initial HEP    Status  Achieved  PT Long Term Goals - 12/10/17 0905      PT LONG TERM GOAL #1   Title  Independent with ongoing strengthening +/- balance HEP as indicated    Status  Partially Met met for current HEP      PT LONG TERM GOAL #2   Title  Overall LE strength >/= 4/5 with L hip and foot at least 4-/5    Status  Partially Met      PT LONG TERM GOAL #3   Title  Pt will ambulate with normal gait pattern with consistent L foot clearance to decrease risk for falls    Status  On-going Pt. still demonstrating decreased L LE clearance with gait       PT LONG TERM GOAL #4   Title  Gait speed >/= 2.62 ft/sec to increase safety with community ambulation    Status  Achieved            Plan - 12/20/17 1026    Clinical Impression Statement  Isaac Escobar still reports that he hasn't had any falls or close calls recently. Progressed his L 4 way ankle strengthening to a green TB to further increase strength gains in the L ankle. Pt. was given a green TB to progress these exercises at home. Due to unsteadiness that pt. is  reporting, majority of therapy session was focused around balance activites. Pt. completed L SLS and tandem standing activites with chair support as needed. Initially Isaac Escobar was "wobbly", however with continued trials showed a slight increase in stability throughout the LE's.     PT Treatment/Interventions  Patient/family education;Neuromuscular re-education;Therapeutic exercise;Therapeutic activities;Functional mobility training;Gait training;Balance training;Electrical Stimulation;Moist Heat;Ultrasound;Traction;Manual techniques;Dry needling;Taping;ADLs/Self Care Home Management    Consulted and Agree with Plan of Care  Patient       Patient will benefit from skilled therapeutic intervention in order to improve the following deficits and impairments:  Abnormal gait, Difficulty walking, Decreased strength, Decreased range of motion, Impaired flexibility, Decreased coordination, Decreased balance, Decreased activity tolerance, Increased muscle spasms  Visit Diagnosis: Foot drop, left  Muscle weakness (generalized)  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   Baldomero Lamy, SPT 12/20/2017, 12:08 PM  [facility] [address]  [place], [place], [address] Phone: [phone]   Fax:  [phone]  Name: Isaac Escobar MRN: [id number] Date of Birth: June 10, 1941

## 2017-12-24 ENCOUNTER — Ambulatory Visit: Payer: Medicare Other | Attending: Neurology | Admitting: Physical Therapy

## 2017-12-24 ENCOUNTER — Encounter: Payer: Self-pay | Admitting: Physical Therapy

## 2017-12-24 DIAGNOSIS — M545 Low back pain, unspecified: Secondary | ICD-10-CM

## 2017-12-24 DIAGNOSIS — M6281 Muscle weakness (generalized): Secondary | ICD-10-CM

## 2017-12-24 DIAGNOSIS — G8929 Other chronic pain: Secondary | ICD-10-CM | POA: Insufficient documentation

## 2017-12-24 DIAGNOSIS — M21372 Foot drop, left foot: Secondary | ICD-10-CM | POA: Diagnosis present

## 2017-12-24 DIAGNOSIS — R296 Repeated falls: Secondary | ICD-10-CM

## 2017-12-24 NOTE — Therapy (Signed)
[facility] [address]  [place], [place], [address] Phone: [phone]   Fax:  [phone]  Physical Therapy Treatment  Patient Details  Name: Isaac Escobar MRN: [id number] Date of Birth: Sep 09, 1941 Referring Provider: Arlice Colt, MD   Encounter Date: 12/24/2017  PT End of Session - 12/24/17 0846    Visit Number  14    Number of Visits  16    Date for PT Re-Evaluation  01/04/18    Authorization Type  Medicare & BCBS    PT Start Time  0846    PT Stop Time  0931    PT Time Calculation (min)  45 min    Equipment Utilized During Treatment  Gait belt    Activity Tolerance  Patient tolerated treatment well    Behavior During Therapy  [facility] for tasks assessed/performed       Past Medical History:  Diagnosis Date  . Cancer [facility])    prostate  . Coronary artery disease   . Hypertension   . Prostate cancer ([place])   . Vision abnormalities     History reviewed. No pertinent surgical history.  There were no vitals filed for this visit.  Subjective Assessment - 12/24/17 0854    Subjective  Pt doing well. No recent falls.    Diagnostic tests  Lumbar MRI 10/13/17: Chronic compression fracture L1.  No acute fracture. Subarticular and foraminal narrowing on the left at L1-2 with mild narrowing of the canal. Extraforaminal disc protrusion on the right L3-4 with displacement of the right L3 nerve root and mild spinal stenosis. Mild spinal stenosis L4-5 with moderate subarticular and foraminal stenosis bilaterally.    Patient Stated Goals  "to get me a good walking gait"    Currently in Pain?  No/denies                       [facility] Adult PT Treatment/Exercise - 12/24/17 0846      High Level Balance   High Level Balance Activities  Side stepping;Tandem walking;Marching forwards    High Level Balance Comments  sidestepping & tandem gait on Airex pad - HHA from PT      Neuro Re-ed    Neuro Re-ed Details   B  side-stepping with cone knock down & righting x9 cones      Knee/Hip Exercises: Aerobic   Recumbent Bike  L2 x 7'      Knee/Hip Exercises: Standing   Other Standing Knee Exercises  Alternating toe-clears from Airex pad to 9" step x10, CGA/SBA of PT               PT Short Term Goals - 11/23/17 2202      PT SHORT TERM GOAL #1   Title  Independent with initial HEP    Status  Achieved        PT Long Term Goals - 12/10/17 0905      PT LONG TERM GOAL #1   Title  Independent with ongoing strengthening +/- balance HEP as indicated    Status  Partially Met met for current HEP      PT LONG TERM GOAL #2   Title  Overall LE strength >/= 4/5 with L hip and foot at least 4-/5    Status  Partially Met      PT LONG TERM GOAL #3   Title  Pt will ambulate with normal gait pattern with consistent L foot clearance to  decrease risk for falls    Status  On-going Pt. still demonstrating decreased L LE clearance with gait       PT LONG TERM GOAL #4   Title  Gait speed >/= 2.62 ft/sec to increase safety with community ambulation    Status  Achieved            Plan - 12/24/17 0931    Clinical Impression Statement  Continued focus on balance training with increased incorporation of unstable/uneven surfaces - pt requiring CGA or HHA with most activities. Therapeutic activities also promoted increased hip and knee flexion along with slightly narrowing BOS to normalize gait pattern. Pt nearing end of current POC, therefore will plan for review/update of HEP on next visit in prep for upcoming discharge.    PT Treatment/Interventions  Patient/family education;Neuromuscular re-education;Therapeutic exercise;Therapeutic activities;Functional mobility training;Gait training;Balance training;Electrical Stimulation;Moist Heat;Ultrasound;Traction;Manual techniques;Dry needling;Taping;ADLs/Self Care Home Management    Consulted and Agree with Plan of Care  Patient       Patient will benefit from  skilled therapeutic intervention in order to improve the following deficits and impairments:  Abnormal gait, Difficulty walking, Decreased strength, Decreased range of motion, Impaired flexibility, Decreased coordination, Decreased balance, Decreased activity tolerance, Increased muscle spasms  Visit Diagnosis: Foot drop, left  Muscle weakness (generalized)  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   Percival Spanish, PT, MPT 12/24/2017, 12:53 PM  [facility] [address]  [place], [place], [address] Phone: [phone]   Fax:  [phone]  Name: Derrik Mceachern MRN: [id number] Date of Birth: 1940-12-04

## 2017-12-27 ENCOUNTER — Ambulatory Visit: Payer: Medicare Other

## 2017-12-27 DIAGNOSIS — M21372 Foot drop, left foot: Secondary | ICD-10-CM | POA: Diagnosis not present

## 2017-12-27 DIAGNOSIS — M545 Low back pain: Secondary | ICD-10-CM

## 2017-12-27 DIAGNOSIS — R296 Repeated falls: Secondary | ICD-10-CM

## 2017-12-27 DIAGNOSIS — G8929 Other chronic pain: Secondary | ICD-10-CM

## 2017-12-27 DIAGNOSIS — M6281 Muscle weakness (generalized): Secondary | ICD-10-CM

## 2017-12-27 NOTE — Therapy (Signed)
[facility] [address]  [place], [place], [address] Phone: [phone]   Fax:  [phone]  Physical Therapy Treatment  Patient Details  Name: Isaac Escobar MRN: [id number] Date of Birth: 1941-05-16 Referring Provider: Arlice Colt, MD   Encounter Date: 12/27/2017  PT End of Session - 12/27/17 0847    Visit Number  15    Number of Visits  16    Date for PT Re-Evaluation  01/04/18    Authorization Type  Medicare & BCBS    PT Start Time  [phone]    PT Stop Time  0935    PT Time Calculation (min)  51 min    Equipment Utilized During Treatment  Gait belt    Activity Tolerance  Patient tolerated treatment well    Behavior During Therapy  [facility] for tasks assessed/performed       Past Medical History:  Diagnosis Date  . Cancer [facility])    prostate  . Coronary artery disease   . Hypertension   . Prostate cancer ([place])   . Vision abnormalities     No past surgical history on file.  There were no vitals filed for this visit.  Subjective Assessment - 12/27/17 0943    Subjective  No recent falls.  Doing well today.      Diagnostic tests  Lumbar MRI 10/13/17: Chronic compression fracture L1.  No acute fracture. Subarticular and foraminal narrowing on the left at L1-2 with mild narrowing of the canal. Extraforaminal disc protrusion on the right L3-4 with displacement of the right L3 nerve root and mild spinal stenosis. Mild spinal stenosis L4-5 with moderate subarticular and foraminal stenosis bilaterally.    Patient Stated Goals  "to get me a good walking gait"    Currently in Pain?  No/denies    Pain Score  0-No pain    Multiple Pain Sites  No                       [facility] Adult PT Treatment/Exercise - 12/27/17 0944      Self-Care   Self-Care  Other Self-Care Comments    Other Self-Care Comments   Pt. with report of brief dizziness upon standing from sitting position thus testing for orthostatic  hypotension with pt. testing negative with supine > sitting > standing positioning changes; discussion of comprehensive HEP to check for understanding and to adjust for appropriateness       Knee/Hip Exercises: Stretches   Other Knee/Hip Stretches  B SKTC stretch x 30 sec each       Knee/Hip Exercises: Aerobic   Recumbent Bike  L2 x 7'      Knee/Hip Exercises: Standing   Heel Raises  20 reps;3 seconds    Heel Raises Limitations  at counter; 3" negative     Hip Flexion  Right;Left;10 reps;Knee straight    Hip Flexion Limitations  green looped TB; UE support on chair     Hip Abduction  Right;Left;Stengthening;10 reps;Knee straight    Abduction Limitations  green looped TB; UE support on chair     Hip Extension  Right;Left;Stengthening;10 reps;Knee straight    Extension Limitations  green looped TB; UE support on chair     Other Standing Knee Exercises  Standing toe raise x 20 reps; UE support on chair       Knee/Hip Exercises: Supine   Bridges with Ball Squeeze  10 reps;Both;Strengthening 3" hold  PT Education - 12/27/17 0949    Education provided  Yes    Education Details  HEP updated and condensed 3-way hip kicker with green looped TB issued to pt., standing heel raise/toe raise, Supine brace march with green TB at knees, SKTC stretch     Person(s) Educated  Patient    Methods  Explanation;Demonstration;Verbal cues;Handout    Comprehension  Verbalized understanding;Returned demonstration;Verbal cues required;Need further instruction       PT Short Term Goals - 11/23/17 0165      PT SHORT TERM GOAL #1   Title  Independent with initial HEP    Status  Achieved        PT Long Term Goals - 12/10/17 0905      PT LONG TERM GOAL #1   Title  Independent with ongoing strengthening +/- balance HEP as indicated    Status  Partially Met met for current HEP      PT LONG TERM GOAL #2   Title  Overall LE strength >/= 4/5 with L hip and foot at least 4-/5    Status   Partially Met      PT LONG TERM GOAL #3   Title  Pt will ambulate with normal gait pattern with consistent L foot clearance to decrease risk for falls    Status  On-going Pt. still demonstrating decreased L LE clearance with gait       PT LONG TERM GOAL #4   Title  Gait speed >/= 2.62 ft/sec to increase safety with community ambulation    Status  Achieved            Plan - 12/27/17 0847    Clinical Impression Statement  Isaac Escobar doing well today reporting no changes and denies falls since last visit.  Some discussion with pt. today regarding nearing end of current POC with pt. verbalizing following discussion that he feels comfortable transitioning to home program following next visit.  Reviewed comprehensive HEP to check for appropriateness with updated HEP issued to pt. via handout.  Green looped TB issued to pt. for 3-way hip kicker.  Will plan for final HEP review and goal testing at upcoming visit.       PT Treatment/Interventions  Patient/family education;Neuromuscular re-education;Therapeutic exercise;Therapeutic activities;Functional mobility training;Gait training;Balance training;Electrical Stimulation;Moist Heat;Ultrasound;Traction;Manual techniques;Dry needling;Taping;ADLs/Self Care Home Management    Consulted and Agree with Plan of Care  Patient       Patient will benefit from skilled therapeutic intervention in order to improve the following deficits and impairments:  Abnormal gait, Difficulty walking, Decreased strength, Decreased range of motion, Impaired flexibility, Decreased coordination, Decreased balance, Decreased activity tolerance, Increased muscle spasms  Visit Diagnosis: Foot drop, left  Muscle weakness (generalized)  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   Bess Harvest, PTA 12/27/17 10:00 AM  [facility] [address]  [place], [place], [address] Phone: [phone]   Fax:  [phone]  Name: Isaac Escobar MRN: [id number] Date of Birth: 30-Dec-1940

## 2017-12-31 ENCOUNTER — Ambulatory Visit: Payer: Medicare Other

## 2017-12-31 DIAGNOSIS — M6281 Muscle weakness (generalized): Secondary | ICD-10-CM

## 2017-12-31 DIAGNOSIS — G8929 Other chronic pain: Secondary | ICD-10-CM

## 2017-12-31 DIAGNOSIS — M545 Low back pain: Secondary | ICD-10-CM

## 2017-12-31 DIAGNOSIS — R296 Repeated falls: Secondary | ICD-10-CM

## 2017-12-31 DIAGNOSIS — M21372 Foot drop, left foot: Secondary | ICD-10-CM | POA: Diagnosis not present

## 2017-12-31 NOTE — Therapy (Signed)
[facility] [place] [address]  [place], [place], [address] Phone: [phone]   Fax:  [phone]  Physical Therapy Treatment  Patient Details  Name: Isaac Escobar MRN: [id number] Date of Birth: 1941-04-27 Referring Provider: Arlice Colt, MD   Encounter Date: 12/31/2017  PT End of Session - 12/31/17 0848    Visit Number  16    Number of Visits  16    Date for PT Re-Evaluation  01/04/18    Authorization Type  Medicare & BCBS    PT Start Time  0845    PT Stop Time  0930    PT Time Calculation (min)  45 min    Equipment Utilized During Treatment  --    Activity Tolerance  Patient tolerated treatment well    Behavior During Therapy  [facility] for tasks assessed/performed       Past Medical History:  Diagnosis Date  . Cancer [facility])    prostate  . Coronary artery disease   . Hypertension   . Prostate cancer ([place])   . Vision abnormalities     No past surgical history on file.  There were no vitals filed for this visit.  Subjective Assessment - 12/31/17 0856    Subjective  Pt. reporting he feels comfortable transitioning to home program following today.      Diagnostic tests  Lumbar MRI 10/13/17: Chronic compression fracture L1.  No acute fracture. Subarticular and foraminal narrowing on the left at L1-2 with mild narrowing of the canal. Extraforaminal disc protrusion on the right L3-4 with displacement of the right L3 nerve root and mild spinal stenosis. Mild spinal stenosis L4-5 with moderate subarticular and foraminal stenosis bilaterally.    Patient Stated Goals  "to get me a good walking gait"    Currently in Pain?  No/denies    Pain Score  0-No pain    Multiple Pain Sites  No         [facility] PT Assessment - 12/31/17 0857      Strength   Strength Assessment Site  Hip;Knee;Ankle    Right/Left Hip  Right;Left    Right Hip Flexion  4-/5    Right Hip Extension  4+/5    Right Hip External Rotation   4/5    Right  Hip Internal Rotation  4+/5    Right Hip ABduction  4/5    Right Hip ADduction  4/5    Left Hip Flexion  4/5    Left Hip Extension  4/5    Left Hip External Rotation  4+/5    Left Hip Internal Rotation  4+/5    Left Hip ABduction  4/5    Left Hip ADduction  4+/5    Right/Left Knee  Right;Left    Right Knee Flexion  4+/5    Right Knee Extension  5/5    Left Knee Flexion  4+/5    Left Knee Extension  5/5    Right/Left Ankle  Right;Left    Right Ankle Dorsiflexion  4+/5    Right Ankle Plantar Flexion  4/5    Right Ankle Inversion  4+/5    Right Ankle Eversion  4+/5    Left Ankle Dorsiflexion  4+/5    Left Ankle Plantar Flexion  4/5    Left Ankle Inversion  4+/5    Left Ankle Eversion  4+/5                   [facility] Adult  PT Treatment/Exercise - 12/31/17 0938      Ambulation/Gait   Ambulation/Gait  Yes    Ambulation/Gait Assistance  5: Supervision    Ambulation/Gait Assistance Details  Cues for increase LE clearance and to ambulate with slightly narrowed BOS; pt. still with tendancy to return to wide BOS with short carryover     Ambulation Distance (Feet)  90 Feet    Gait Pattern  Decreased hip/knee flexion - left;Decreased dorsiflexion - left Wide BOS      High Level Balance   High Level Balance Activities  Side stepping;Tandem walking + monster walk with red looped TB at ankles + 2 HH assist       Self-Care   Self-Care  Other Self-Care Comments    Other Self-Care Comments   Final review of comprehensive HEP with pt. verbalizing understanding       Knee/Hip Exercises: Aerobic   Recumbent Bike  L2 x 7'      Knee/Hip Exercises: Standing   Heel Raises  20 reps;3 seconds    Heel Raises Limitations  at counter; 3" negative     Other Standing Knee Exercises  Alternating toe-clears to 9" step on airex pad + 1 HH asssist; UE support on chair                PT Short Term Goals - 11/23/17 0853      PT SHORT TERM GOAL #1   Title  Independent with initial HEP     Status  Achieved        PT Long Term Goals - 12/31/17 0921      PT LONG TERM GOAL #1   Title  Independent with ongoing strengthening +/- balance HEP as indicated    Status  Achieved      PT LONG TERM GOAL #2   Title  Overall LE strength >/= 4/5 with L hip and foot at least 4-/5    Status  Partially Met Met except R hip flexion 4-/5       PT LONG TERM GOAL #3   Title  Pt will ambulate with normal gait pattern with consistent L foot clearance to decrease risk for falls    Status  Partially Met Improved LE clearance with gait however still ambulating with wide BOS        PT LONG TERM GOAL #4   Title  Gait speed >/= 2.62 ft/sec to increase safety with community ambulation    Status  Achieved            Plan - 12/31/17 1222    Clinical Impression Statement  Rhodes making good progress with therapy to this point.  Able to improve LE clearance with gait demonstrating improved safety and reports no recent falls since starting therapy.  Does still ambulate with wide BOS.  Able to meet strength goal today with exception of R hip flexion still 4-/5 with MMT.  Pt. aware that today is his last visit in current POC and verbalized he feels comfortable transitioning to home program following today's visit.  Supervising PT approving plan for pt. d/c per previous discussion.  Comprehensive HEP reviewed again with pt. today to check for understanding.  Pt. verbalizing understanding of this.  Pt. now d/c from therapy.      PT Treatment/Interventions  Patient/family education;Neuromuscular re-education;Therapeutic exercise;Therapeutic activities;Functional mobility training;Gait training;Balance training;Electrical Stimulation;Moist Heat;Ultrasound;Traction;Manual techniques;Dry needling;Taping;ADLs/Self Care Home Management    PT Next Visit Plan  d/c     Consulted and Agree with Plan   of Care  Patient       Patient will benefit from skilled therapeutic intervention in order to improve the  following deficits and impairments:  Abnormal gait, Difficulty walking, Decreased strength, Decreased range of motion, Impaired flexibility, Decreased coordination, Decreased balance, Decreased activity tolerance, Increased muscle spasms  Visit Diagnosis: Foot drop, left  Muscle weakness (generalized)  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   Micah Denny, PTA 12/31/17 12:42 PM  [facility] [place] [facility] [address]  [address] [place], [place], [address] Phone: [phone]   Fax:  [phone]  Name: Jung Crandle MRN: [id number] Date of Birth: 10/27/1940    PHYSICAL THERAPY DISCHARGE SUMMARY  Visits from Start of Care: 16  Current functional level related to goals / functional outcomes:   Shahil has demonstrated good progress with PT with improved gait pattern including consistent foot clearance, although still reverts to wide BOS with decreased hip & knee flexion w/o cues. He reports no recent falls and increased comfort with ambulation on uneven surfaces when attending sporting events for his grandchildren. He has demonstrated good improvements in strength, is independent with ongoing HEP and feels comfortable continuing with HEP on his own, therefore will proceed with transition to HEP with discharge from PT for this episode.   Remaining deficits:   As above.   Education / Equipment:   HEP  Plan: Patient agrees to discharge.  Patient goals were partially met. Patient is being discharged due to being pleased with the current functional level.  ?????         JoAnne M. Kreis, PT, MPT 12/31/17, 4:13 PM   [facility] [place] [address]  [address] [place], [place], [address] Phone: [phone]   Fax:  [phone]    

## 2018-01-03 ENCOUNTER — Ambulatory Visit: Payer: Medicare Other | Admitting: Physical Therapy

## 2018-02-27 ENCOUNTER — Telehealth: Payer: Self-pay | Admitting: *Deleted

## 2018-02-27 ENCOUNTER — Ambulatory Visit (INDEPENDENT_AMBULATORY_CARE_PROVIDER_SITE_OTHER): Payer: Medicare Other | Admitting: Neurology

## 2018-02-27 ENCOUNTER — Encounter: Payer: Self-pay | Admitting: Neurology

## 2018-02-27 DIAGNOSIS — M21372 Foot drop, left foot: Secondary | ICD-10-CM | POA: Diagnosis not present

## 2018-02-27 DIAGNOSIS — M545 Low back pain, unspecified: Secondary | ICD-10-CM | POA: Insufficient documentation

## 2018-02-27 DIAGNOSIS — M5432 Sciatica, left side: Secondary | ICD-10-CM | POA: Diagnosis not present

## 2018-02-27 NOTE — Telephone Encounter (Signed)
Order for left AFO, along with ins. info and demo sheet faxed to [employer] in [place], fax# [phone]

## 2018-02-27 NOTE — Progress Notes (Signed)
[facility]  PATIENT: Isaac Escobar DOB: Jun 22, 1941  REFERRING DOCTOR OR PCP:  Dr. Vista Lawman  SOURCE: No trouble Dr. Vista Lawman,  bridging reports, MRI images on PACS.  _________________________________   HISTORICAL  CHIEF COMPLAINT:  Chief Complaint  Patient presents with  . Back Pain    Sts.Celebrex helps pain--only occ. has left lower back pain that goes across to right lower back.  This is when he feels left leg is going to give way.  2 falls since last ov. Believes PT is helping some, not enough.  Sts. there is one exercise where he has to step over pool noodles, but he is not able to lift left foot up enough to step over them./fim  . Extremity Weakness  . Left Foot Drop    HISTORY OF PRESENT ILLNESS:  Isaac Escobar is a 77 yo man back pain and left foot drop.     Update 02/27/2018: His LBP is doing better with Celebrex and PT.    He still has a burning pinch sensation in the right LB at times.   No pain radiatesdown the leg.   Mowing may exacerbate the pain.   An ESI had not helped in the past.      His gait is off due to a left foot drop.    He has tripped a few times when the left leg drags.   He has 2 falls.   He wil sometimes use a walking stick.   We had discussed an AFO brace at the last visit.     From 10/30/2017: He is a 77 year old man who has had back pain off and on for many years and has a 1 year history of left leg weakness and his gait and causing some falls.    He reports discomfort in the lower back and pain that goes down the left leg towards the knee and lower leg but not into the foot.     Walking worsens the pain.    He notes a mild left foot drop, worse after walking longer distances.    He stumbles some and has fallen several times..    He denies fixed numbness.     He is not on any medications for pain or spasticity.      He had a lumbar ESI 10/2016 ([facility]) but had no benefit.     PT had helped mildly but there was no sustained benefit.  The  MRI report from 10/16/2016 was formally interpreted as below. By my review of the images, the disc protrusion to the left at L4-L5 combined with the facet degenerative changes and ligamentum flavum hypertrophy lead to moderately severe lateral recess stenosis on the left with potential left L5 nerve root compression. There is also encroachment on the right L3 nerve root.      IMPRESSION: Chronic compression fracture L1.  No acute fracture.  Subarticular and foraminal narrowing on the left at L1-2 with mild narrowing of the canal.  Extraforaminal disc protrusion on the right L3-4 with displacement of the right L3 nerve root. Mild spinal stenosis.  Mild spinal stenosis L4-5 with moderate subarticular and foraminal stenosis bilaterally.  He has a h/o of an MI in the past.   He has had prostate cancer.   There was no evidecne of cancer in the lumbar spine.      REVIEW OF SYSTEMS: Constitutional: No fevers, chills, sweats, or change in appetite Eyes: No visual changes, double vision, eye pain Ear, nose and  throat: No hearing loss, ear pain, nasal congestion, sore throat Cardiovascular: No chest pain, palpitations.  He has history of myocardial infarction Respiratory: No shortness of breath at rest or with exertion.   No wheezes GastrointestinaI: No nausea, vomiting, diarrhea, abdominal pain, fecal incontinence Genitourinary: No dysuria, urinary retention or frequency.  No nocturia. Musculoskeletal: As above Integumentary: No rash, pruritus, skin lesions Neurological: as above Psychiatric: No depression at this time.  No anxiety Endocrine: No palpitations, diaphoresis, change in appetite, change in weigh or increased thirst Hematologic/Lymphatic: No anemia, purpura, petechiae. Allergic/Immunologic: No itchy/runny eyes, nasal congestion, recent allergic reactions, rashes  ALLERGIES: Allergies  Allergen Reactions  . Penicillins Swelling    HOME MEDICATIONS:  Current Outpatient  Medications:  .  aspirin 81 MG chewable tablet, Chew 81 mg by mouth daily., Disp: , Rfl:  .  atorvastatin (LIPITOR) 80 MG tablet, , Disp: , Rfl:  .  celecoxib (CELEBREX) 200 MG capsule, Take 1 capsule (200 mg total) by mouth daily., Disp: 30 capsule, Rfl: 11 .  metoprolol succinate (TOPROL-XL) 25 MG 24 hr tablet, , Disp: , Rfl:   PAST MEDICAL HISTORY: Past Medical History:  Diagnosis Date  . Cancer [facility])    prostate  . Coronary artery disease   . Hypertension   . Prostate cancer ([place])   . Vision abnormalities     PAST SURGICAL HISTORY: No past surgical history on file.  FAMILY HISTORY: Family History  Problem Relation Age of Onset  . Dementia Mother   . Cirrhosis Father     SOCIAL HISTORY:  Social History   Socioeconomic History  . Marital status: Married    Spouse name: Not on file  . Number of children: Not on file  . Years of education: Not on file  . Highest education level: Not on file  Occupational History  . Not on file  Social Needs  . Financial resource strain: Not on file  . Food insecurity:    Worry: Not on file    Inability: Not on file  . Transportation needs:    Medical: Not on file    Non-medical: Not on file  Tobacco Use  . Smoking status: Never Smoker  . Smokeless tobacco: Current User  Substance and Sexual Activity  . Alcohol use: No    Frequency: Never  . Drug use: No  . Sexual activity: Not on file  Lifestyle  . Physical activity:    Days per week: Not on file    Minutes per session: Not on file  . Stress: Not on file  Relationships  . Social connections:    Talks on phone: Not on file    Gets together: Not on file    Attends religious service: Not on file    Active member of club or organization: Not on file    Attends meetings of clubs or organizations: Not on file    Relationship status: Not on file  . Intimate partner violence:    Fear of current or ex partner: Not on file    Emotionally abused: Not on file    Physically  abused: Not on file    Forced sexual activity: Not on file  Other Topics Concern  . Not on file  Social History Narrative  . Not on file     PHYSICAL EXAM  There were no vitals filed for this visit.  There is no height or weight on file to calculate BMI.   General: The patient is well-developed and well-nourished and  in no acute distress   Musculoskeletal:  Back is nontender  Neurologic Exam  Mental status: The patient is alert and oriented x 3 at the time of the examination. The patient has apparent normal recent and remote memory, with an apparently normal attention span and concentration ability.   Speech is normal.  Cranial nerves: Extraocular movements are full.  Facial strength and sensation is normal.  Trapezius strength is normal.  No dysarthria is noted.  The tongue is midline, and the patient has symmetric elevation of the soft palate. No obvious hearing deficits are noted.  Motor:  Muscle bulk is normal.   Tone is normal. Strength is  5 / 5 in the arms and the right leg.  Strength is reduced in the left foot and ankle  Sensory: Sensory testing shows normal touch and vibration in the legs. Coordination: Cerebellar testing reveals good finger-nose-finger and heel-to-shin bilaterally.  Gait and station: Station is normal.  The gait is mildly wide and he has a left foot drop.  He cannot do a tandem walk.   Reflexes:   Deep tendon reflexes were symmetric at the knees and ankles.  DIAGNOSTIC DATA (LABS, IMAGING, TESTING) - I reviewed patient records, labs, notes, testing and imaging myself where available.  Lab Results  Component Value Date   WBC 5.9 10/13/2017   HGB 13.0 10/13/2017   HCT 40.3 10/13/2017   MCV 93.3 10/13/2017   PLT 178 10/13/2017      Component Value Date/Time   NA 139 10/13/2017 1009   K 4.1 10/13/2017 1009   CL 105 10/13/2017 1009   CO2 23 10/13/2017 1009   GLUCOSE 111 (H) 10/13/2017 1009   BUN 17 10/13/2017 1009   CREATININE 0.83  10/13/2017 1009   CALCIUM 8.9 10/13/2017 1009   PROT 6.5 10/13/2017 1009   ALBUMIN 4.0 10/13/2017 1009   AST 24 10/13/2017 1009   ALT 20 10/13/2017 1009   ALKPHOS 76 10/13/2017 1009   BILITOT 1.4 (H) 10/13/2017 1009   GFRNONAA >60 10/13/2017 1009   GFRAA >60 10/13/2017 1009       ASSESSMENT AND PLAN  Left sided sciatica  Left foot drop  Right-sided low back pain without sciatica, unspecified chronicity  1.   Continue Celebrex. 2,    referral for an AFO for the foot/ankle drop 3.    Return in 6 months or sooner if there are new or worsening neurologic symptoms.  Evamae Rowen A. Felecia Shelling, MD, [facility] 05/26/4781, 9:56 PM Certified in Neurology, Clinical Neurophysiology, Sleep Medicine, Pain Medicine and Neuroimaging  [facility] [address] [place], [place] [address] [phone]

## 2018-02-27 NOTE — Telephone Encounter (Signed)
Order for left AFO, along with ins. info and demo sheet, faxed to [employer] in [place], fax# [phone]

## 2018-10-30 ENCOUNTER — Other Ambulatory Visit: Payer: Self-pay | Admitting: Neurology

## 2019-09-22 IMAGING — CR DG LUMBAR SPINE COMPLETE 4+V
5 series · 6 of 6 positions shown · non-contrast
Comparison: None.

CLINICAL DATA: Frequent mechanical falls.

EXAM:
LUMBAR SPINE - COMPLETE 4+ VIEW

[l-spine ap]
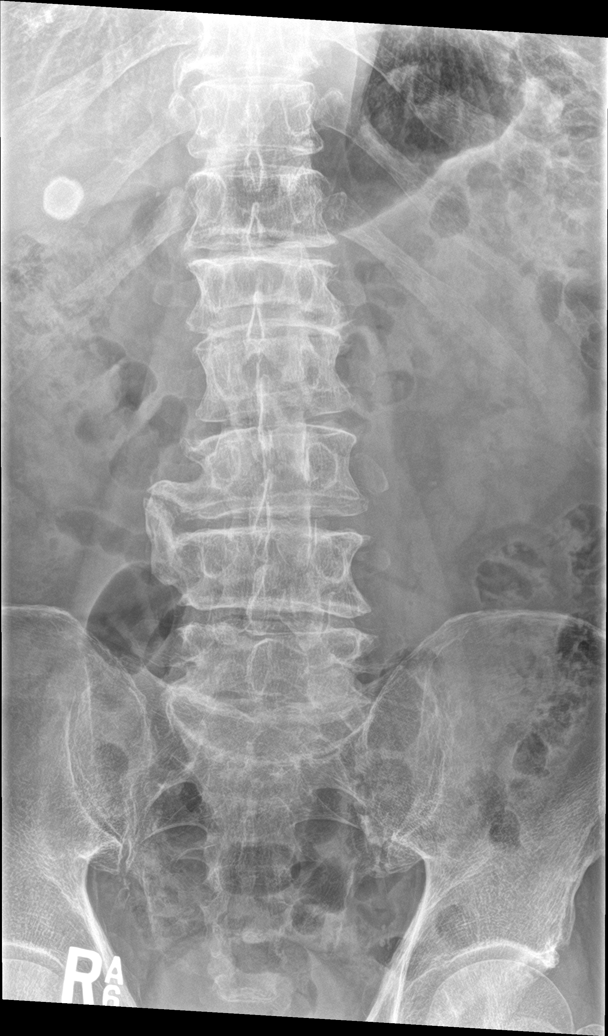

[Series 2: l-spine obl · 0.14mm/px · 2 of 2 slices shown (1 of 2)]
[im 1/2]
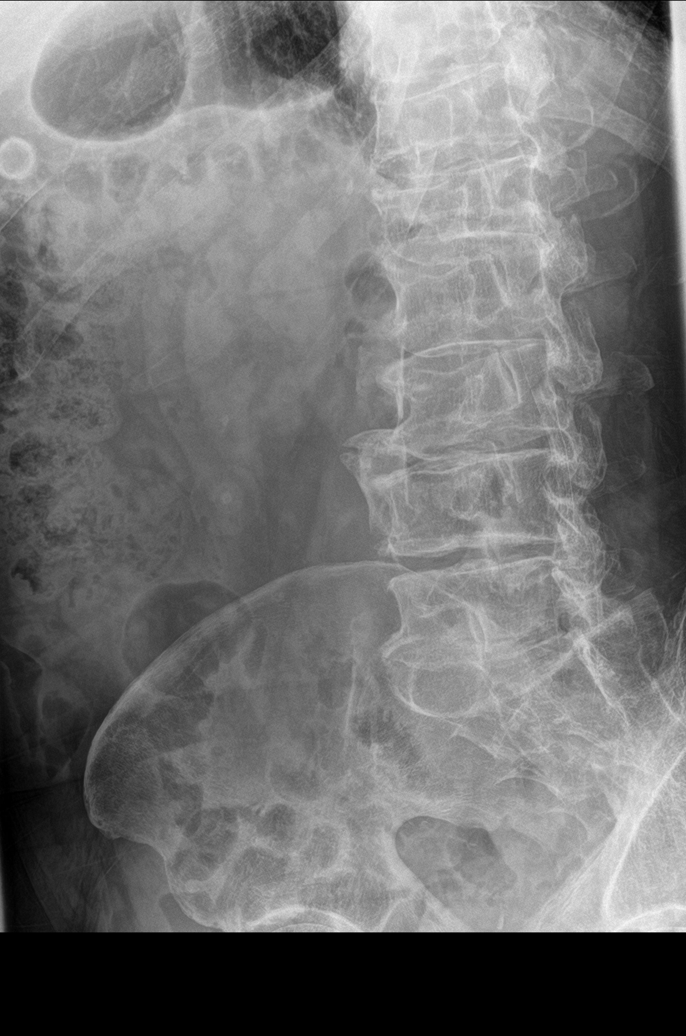
[im 2/2]
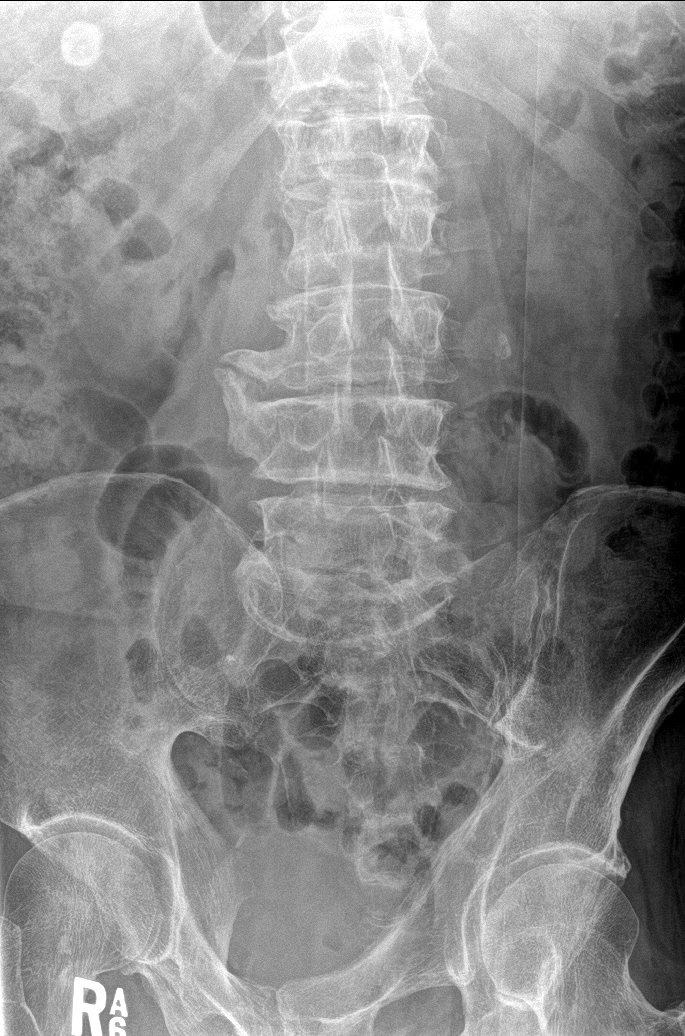

[l-spine obl (2 of 2)]
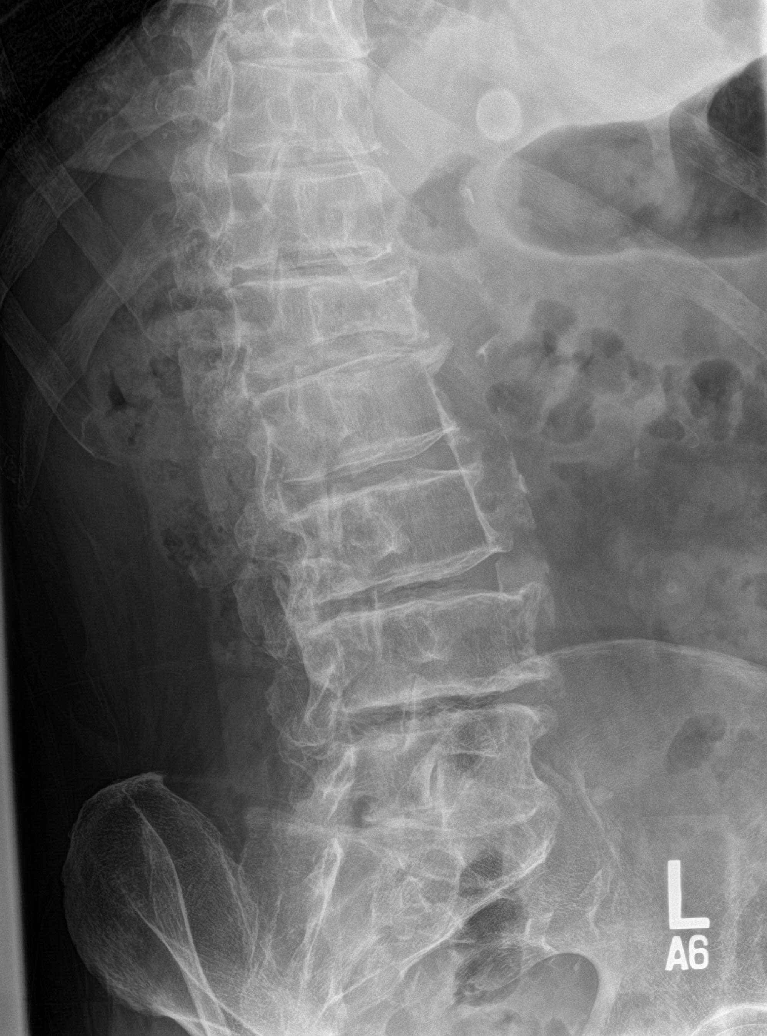

[l-spine lat]
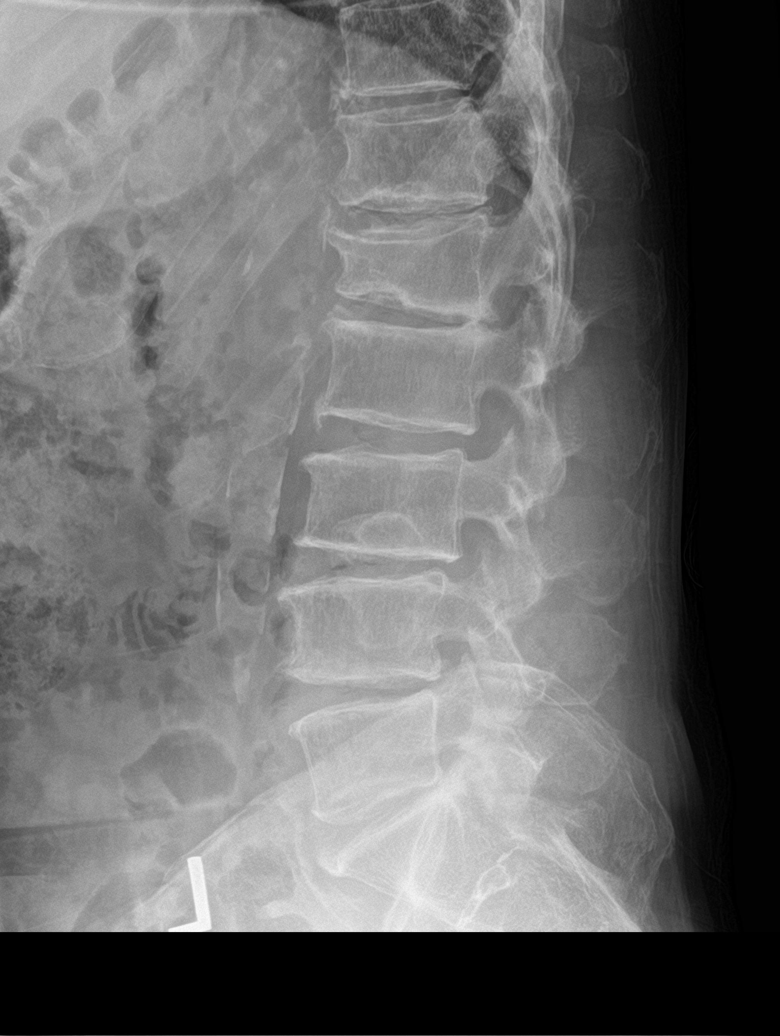

[l-spine spot]
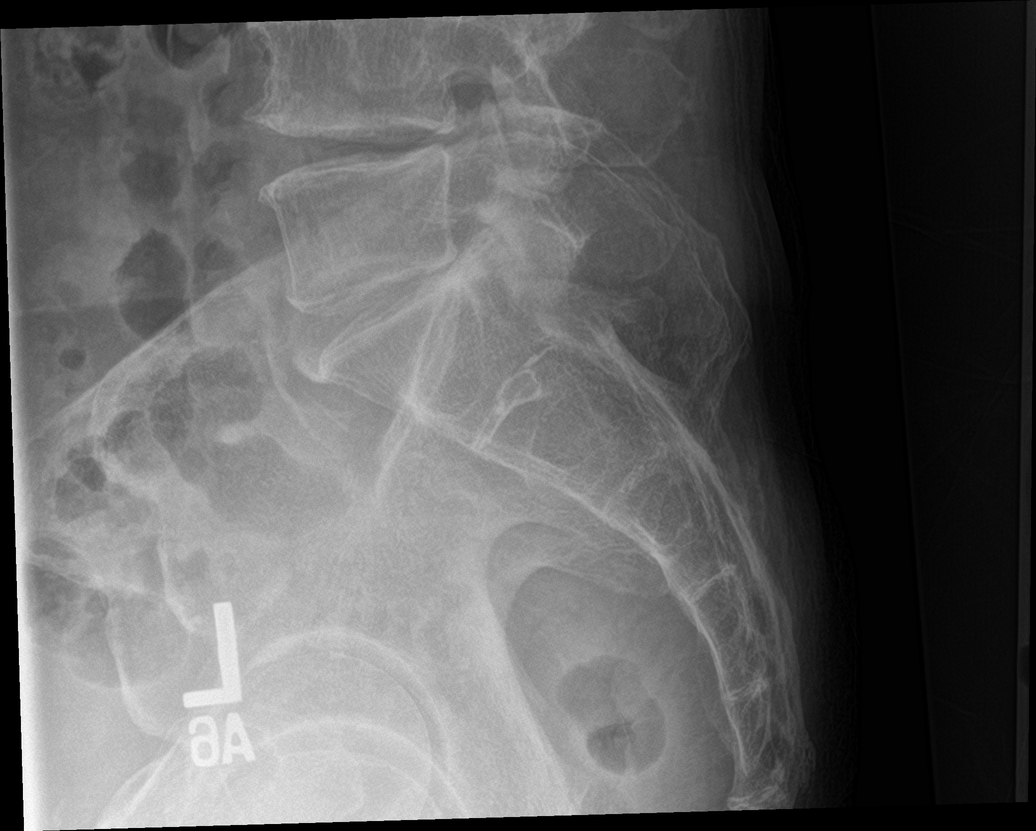

[6 of 6 positions shown; findings below may reference images not displayed]

FINDINGS: Five non-rib-bearing vertebral bodies. Anterior compression
deformity of L1 vertebral body with approximately 30% height loss.
Multilevel osteoarthritic changes with disc space narrowing,
endplate sclerosis and remodeling of vertebral bodies. Reversal of
lumbosacral lordosis.

Heavy calcific atherosclerotic disease of the aorta.

1.6 cm calcific density overlies the right upper quadrant, possibly
representing a gallbladder stone.
IMPRESSION: L1 anterior compression deformity with approximately 30% height loss
likely represents age-indeterminate fracture.

Multilevel osteoarthritic changes of the lumbosacral spine,
moderate.

Calcific atherosclerotic disease of the aorta.

Probable cholelithiasis.

## 2019-09-22 IMAGING — CT CT HEAD W/O CM
4 series · 16 of 47 positions shown, 18 images · non-contrast
Comparison: No priors.

CLINICAL DATA: 76-year-old male with history of multiple falls over
the past several weeks.

EXAM:
CT HEAD WITHOUT CONTRAST
TECHNIQUE: Contiguous axial images were obtained from the base of the skull
through the vertex without intravenous contrast.

[Series 3: head wo · axial · 0.46mm/px · z∈[+1346,+1466]mm · 7 of 33 slices shown, 9 images]
[im 5/33  brain]
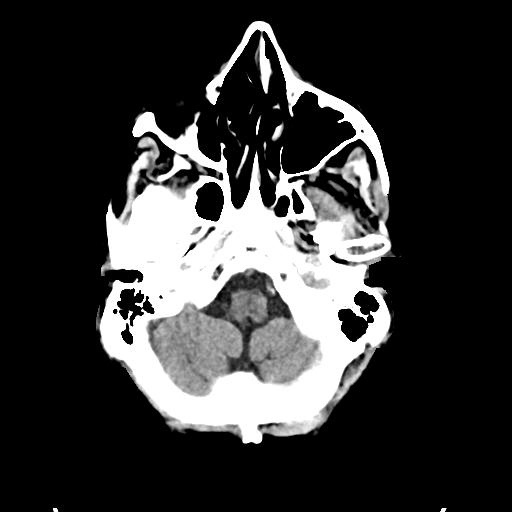
[im 5/33  bone]
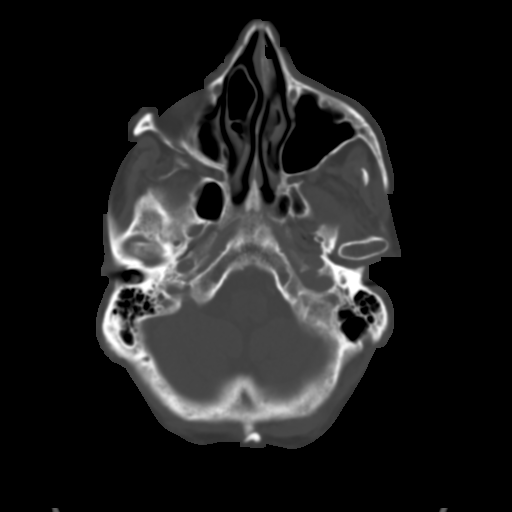
[im 9/33  brain]
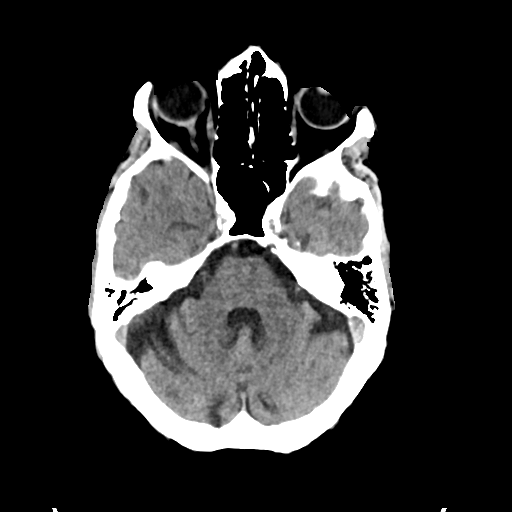
[im 13/33  brain]
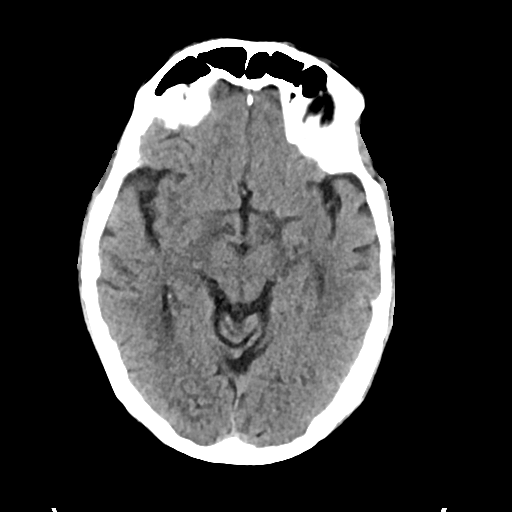
[im 17/33  brain]
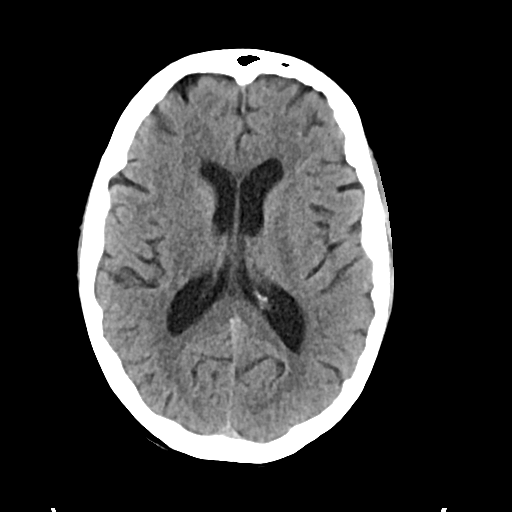
[im 21/33  brain]
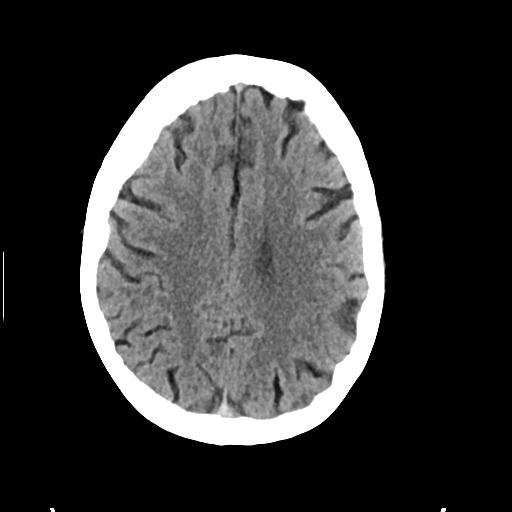
[im 21/33  bone]
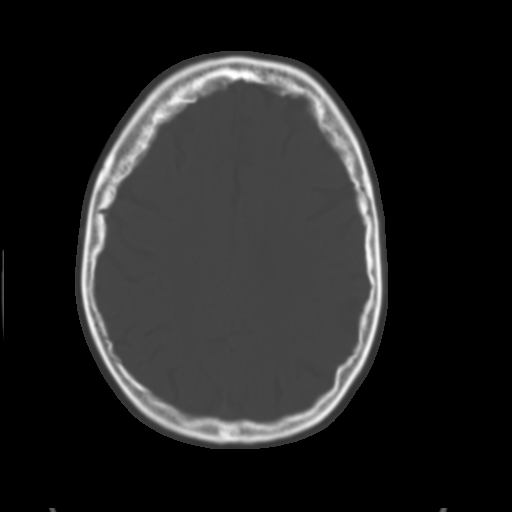
[im 25/33  brain]
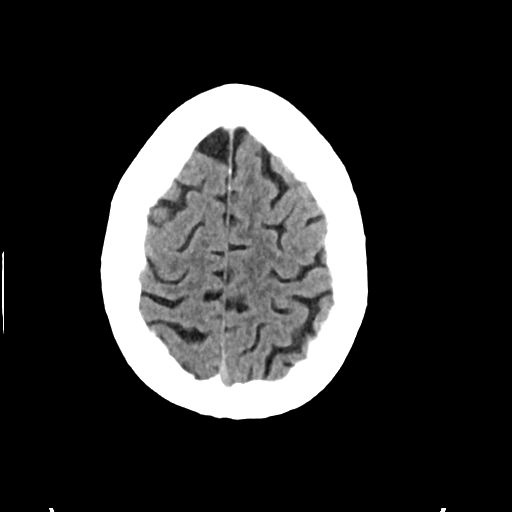
[im 29/33  brain]
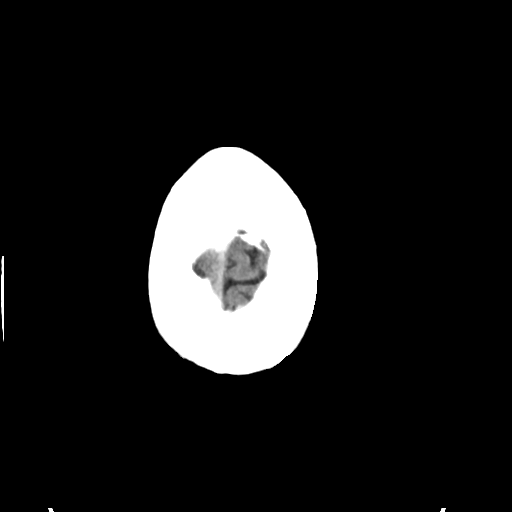

[Series 4: head bone · axial · 0.46mm/px · z∈[+1342,+1374]mm · 3 of 81 slices shown]
[im 9/81  bone]
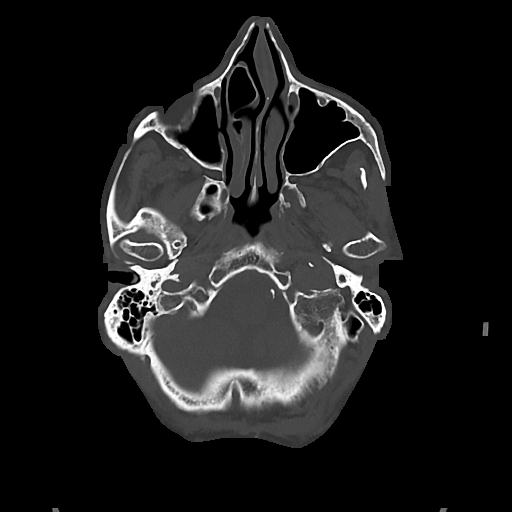
[im 17/81  bone]
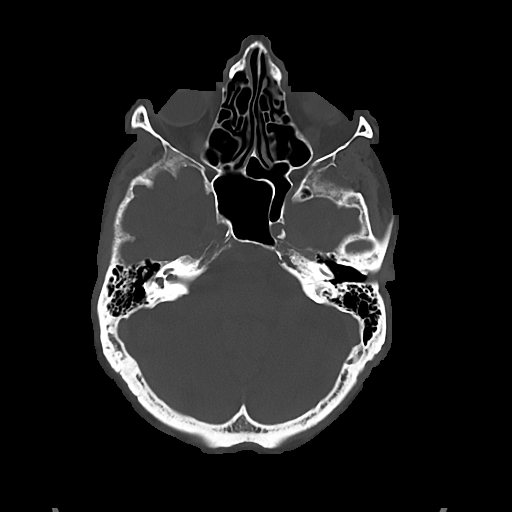
[im 25/81  bone]
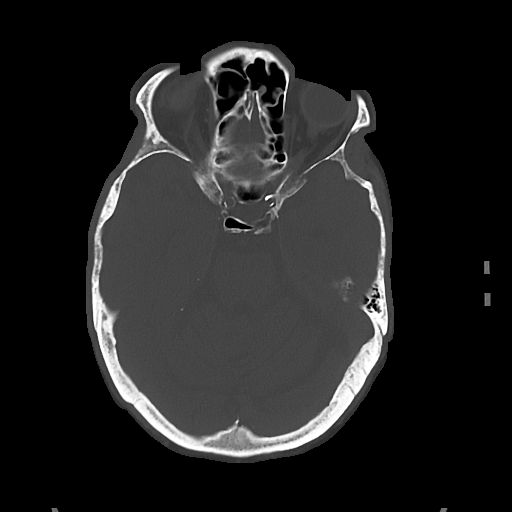

[Series 5: cor soft · coronal · 0.34mm/px · 3 of 66 slices shown]
[im 22/66  brain]
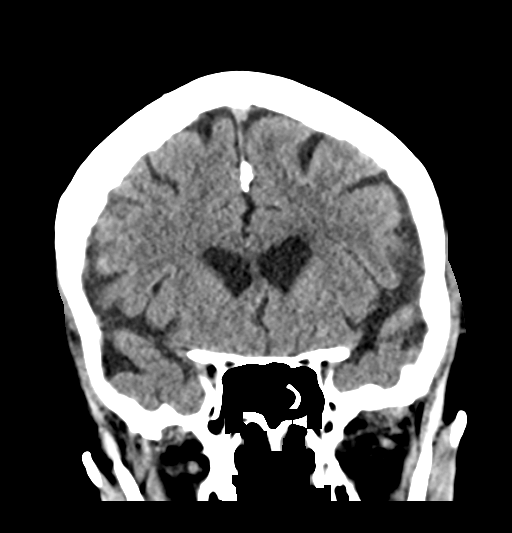
[im 29/66  brain]
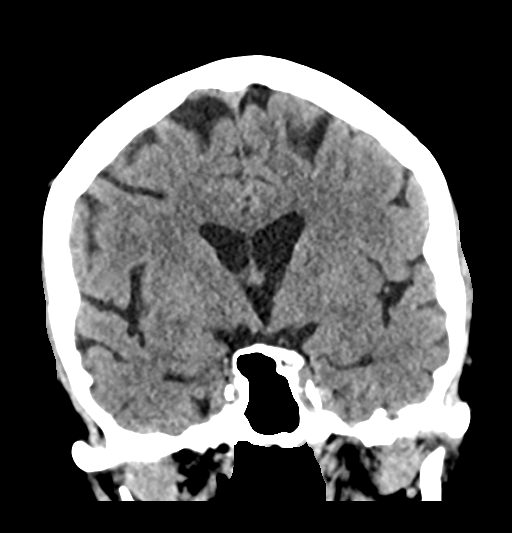
[im 37/66  brain]
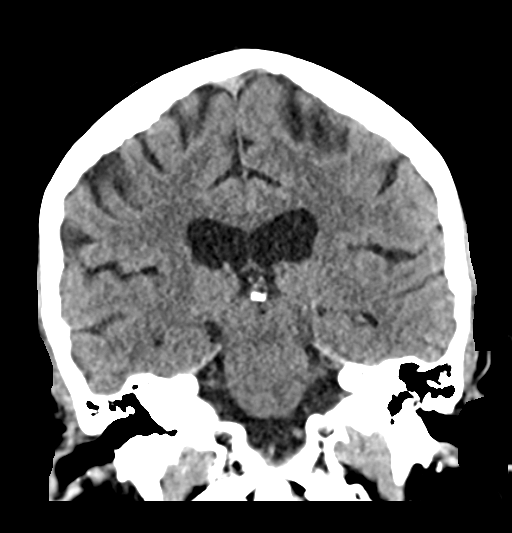

[Series 6: sag soft · sagittal · 0.34mm/px · 3 of 52 slices shown]
[im 18/52  brain]
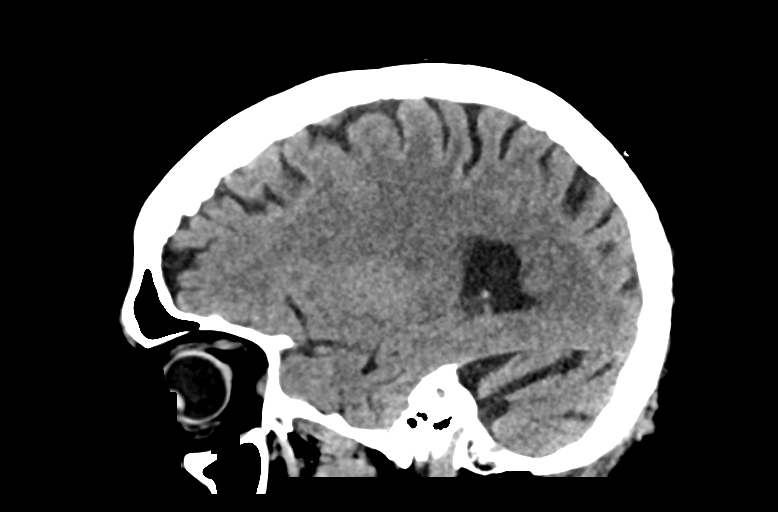
[im 26/52  brain]
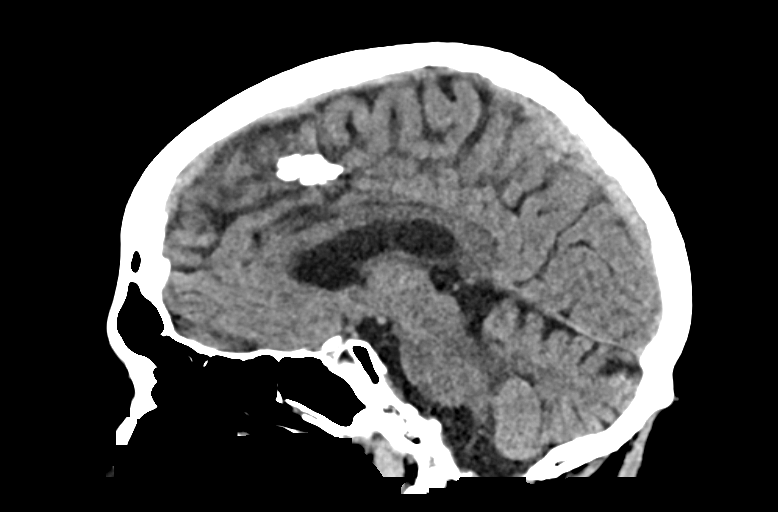
[im 35/52  brain]
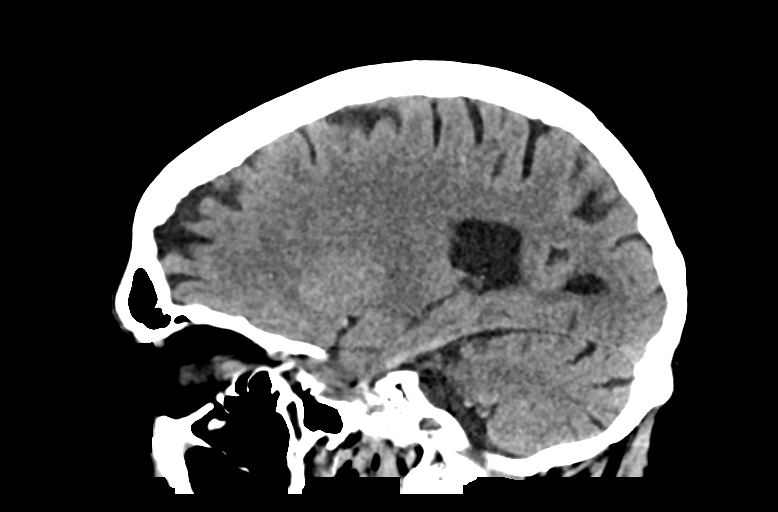

[16 of 47 positions shown; findings below may reference images not displayed]

FINDINGS: Brain: Patchy and confluent areas of mild decreased attenuation are
noted throughout the deep and periventricular white matter of the
cerebral hemispheres bilaterally, compatible with mild chronic
microvascular ischemic disease. No evidence of acute infarction,
hemorrhage, hydrocephalus, extra-axial collection or mass
lesion/mass effect.

Vascular: No hyperdense vessel or unexpected calcification.

Skull: Normal. Negative for fracture or focal lesion.

Sinuses/Orbits: No acute finding.

Other: None.
IMPRESSION: 1. No acute intracranial abnormalities.
2. Mild chronic microvascular ischemic changes in the cerebral white
matter.
